# Patient Record
Sex: Female | Born: 1975
Health system: Southern US, Community
[De-identification: ages and names within clinical notes are randomized; demographics above are authoritative.]

## PROBLEM LIST (undated history)

## (undated) DIAGNOSIS — I10 Essential (primary) hypertension: Secondary | ICD-10-CM

## (undated) DIAGNOSIS — F410 Panic disorder [episodic paroxysmal anxiety] without agoraphobia: Secondary | ICD-10-CM

## (undated) DIAGNOSIS — F419 Anxiety disorder, unspecified: Secondary | ICD-10-CM

## (undated) DIAGNOSIS — M069 Rheumatoid arthritis, unspecified: Secondary | ICD-10-CM

## (undated) DIAGNOSIS — J309 Allergic rhinitis, unspecified: Secondary | ICD-10-CM

## (undated) DIAGNOSIS — C4491 Basal cell carcinoma of skin, unspecified: Secondary | ICD-10-CM

## (undated) DIAGNOSIS — I2542 Coronary artery dissection: Secondary | ICD-10-CM

## (undated) DIAGNOSIS — I214 Non-ST elevation (NSTEMI) myocardial infarction: Secondary | ICD-10-CM

## (undated) DIAGNOSIS — Z9289 Personal history of other medical treatment: Secondary | ICD-10-CM

## (undated) HISTORY — DX: Basal cell carcinoma of skin, unspecified: C44.91

## (undated) HISTORY — DX: Allergic rhinitis, unspecified: J30.9

## (undated) HISTORY — DX: Coronary artery dissection: I25.42

## (undated) HISTORY — DX: Personal history of other medical treatment: Z92.89

## (undated) HISTORY — DX: Non-ST elevation (NSTEMI) myocardial infarction: I21.4

## (undated) HISTORY — PX: KNEE SURGERY: SHX244

## (undated) HISTORY — DX: Anxiety disorder, unspecified: F41.9

## (undated) HISTORY — PX: NECK SURGERY: SHX720

---

## 2004-04-15 DIAGNOSIS — C4491 Basal cell carcinoma of skin, unspecified: Secondary | ICD-10-CM

## 2004-04-15 HISTORY — DX: Basal cell carcinoma of skin, unspecified: C44.91

## 2004-06-17 ENCOUNTER — Observation Stay: Payer: Self-pay

## 2004-06-19 ENCOUNTER — Inpatient Hospital Stay: Payer: Self-pay | Admitting: Unknown Physician Specialty

## 2006-04-20 ENCOUNTER — Inpatient Hospital Stay: Payer: Self-pay | Admitting: Obstetrics & Gynecology

## 2009-08-03 ENCOUNTER — Encounter: Payer: Self-pay | Admitting: Obstetrics & Gynecology

## 2009-08-03 ENCOUNTER — Encounter: Payer: Self-pay | Admitting: Maternal and Fetal Medicine

## 2009-08-13 ENCOUNTER — Encounter: Payer: Self-pay | Admitting: Maternal and Fetal Medicine

## 2009-11-01 ENCOUNTER — Observation Stay: Payer: Self-pay

## 2009-11-09 ENCOUNTER — Inpatient Hospital Stay: Payer: Self-pay | Admitting: Obstetrics & Gynecology

## 2009-11-11 DIAGNOSIS — O10919 Unspecified pre-existing hypertension complicating pregnancy, unspecified trimester: Secondary | ICD-10-CM

## 2009-11-11 DIAGNOSIS — O4100X0 Oligohydramnios, unspecified trimester, not applicable or unspecified: Secondary | ICD-10-CM

## 2010-07-15 HISTORY — PX: HERNIA REPAIR: SHX51

## 2010-07-30 ENCOUNTER — Ambulatory Visit: Payer: Self-pay | Admitting: General Surgery

## 2011-09-25 ENCOUNTER — Ambulatory Visit: Payer: Self-pay | Admitting: Internal Medicine

## 2016-01-01 ENCOUNTER — Other Ambulatory Visit: Payer: Self-pay | Admitting: Certified Nurse Midwife

## 2016-05-08 ENCOUNTER — Other Ambulatory Visit: Payer: Self-pay | Admitting: Certified Nurse Midwife

## 2016-05-08 DIAGNOSIS — Z1231 Encounter for screening mammogram for malignant neoplasm of breast: Secondary | ICD-10-CM

## 2016-06-06 ENCOUNTER — Ambulatory Visit
Admission: RE | Admit: 2016-06-06 | Discharge: 2016-06-06 | Disposition: A | Discharge status: Home / Self Care | Payer: BLUE CROSS/BLUE SHIELD | Source: Ambulatory Visit | Attending: Certified Nurse Midwife | Admitting: Certified Nurse Midwife

## 2016-06-06 DIAGNOSIS — Z1231 Encounter for screening mammogram for malignant neoplasm of breast: Secondary | ICD-10-CM | POA: Insufficient documentation

## 2016-06-18 ENCOUNTER — Other Ambulatory Visit: Payer: Self-pay | Admitting: *Deleted

## 2016-06-18 ENCOUNTER — Inpatient Hospital Stay
Admission: RE | Admit: 2016-06-18 | Discharge: 2016-06-18 | Disposition: A | Discharge status: Home / Self Care | Payer: Self-pay | Source: Ambulatory Visit | Attending: *Deleted | Admitting: *Deleted

## 2016-06-18 DIAGNOSIS — Z9289 Personal history of other medical treatment: Secondary | ICD-10-CM

## 2017-04-15 DIAGNOSIS — I214 Non-ST elevation (NSTEMI) myocardial infarction: Secondary | ICD-10-CM

## 2017-04-15 HISTORY — DX: Non-ST elevation (NSTEMI) myocardial infarction: I21.4

## 2017-04-30 ENCOUNTER — Inpatient Hospital Stay
Admission: EM | Admit: 2017-04-30 | Discharge: 2017-05-03 | DRG: 281 | Disposition: A | Discharge status: Home / Self Care | Payer: BLUE CROSS/BLUE SHIELD | Attending: Family Medicine | Admitting: Family Medicine

## 2017-04-30 ENCOUNTER — Emergency Department: Payer: BLUE CROSS/BLUE SHIELD

## 2017-04-30 ENCOUNTER — Encounter: Payer: Self-pay | Admitting: Emergency Medicine

## 2017-04-30 DIAGNOSIS — I214 Non-ST elevation (NSTEMI) myocardial infarction: Principal | ICD-10-CM | POA: Diagnosis present

## 2017-04-30 DIAGNOSIS — Z8249 Family history of ischemic heart disease and other diseases of the circulatory system: Secondary | ICD-10-CM

## 2017-04-30 DIAGNOSIS — Z79899 Other long term (current) drug therapy: Secondary | ICD-10-CM

## 2017-04-30 DIAGNOSIS — R748 Abnormal levels of other serum enzymes: Secondary | ICD-10-CM | POA: Diagnosis present

## 2017-04-30 DIAGNOSIS — I472 Ventricular tachycardia, unspecified: Secondary | ICD-10-CM

## 2017-04-30 DIAGNOSIS — G4700 Insomnia, unspecified: Secondary | ICD-10-CM | POA: Diagnosis present

## 2017-04-30 DIAGNOSIS — M069 Rheumatoid arthritis, unspecified: Secondary | ICD-10-CM | POA: Diagnosis present

## 2017-04-30 DIAGNOSIS — I10 Essential (primary) hypertension: Secondary | ICD-10-CM | POA: Diagnosis present

## 2017-04-30 DIAGNOSIS — F411 Generalized anxiety disorder: Secondary | ICD-10-CM | POA: Diagnosis present

## 2017-04-30 DIAGNOSIS — I2510 Atherosclerotic heart disease of native coronary artery without angina pectoris: Secondary | ICD-10-CM | POA: Diagnosis present

## 2017-04-30 DIAGNOSIS — F410 Panic disorder [episodic paroxysmal anxiety] without agoraphobia: Secondary | ICD-10-CM | POA: Diagnosis present

## 2017-04-30 HISTORY — DX: Essential (primary) hypertension: I10

## 2017-04-30 HISTORY — DX: Panic disorder (episodic paroxysmal anxiety): F41.0

## 2017-04-30 HISTORY — DX: Rheumatoid arthritis, unspecified: M06.9

## 2017-04-30 LAB — TROPONIN I: Troponin I: 0.25 ng/mL (ref <0.03)

## 2017-04-30 LAB — BASIC METABOLIC PANEL
Anion gap: 8 (ref 5–15)
BUN: 20 mg/dL (ref 6–20)
CO2: 28 mmol/L (ref 22–32)
Calcium: 9.4 mg/dL (ref 8.9–10.3)
Chloride: 102 mmol/L (ref 101–111)
Creatinine, Ser: 0.79 mg/dL (ref 0.44–1.00)
GFR calc Af Amer: >60 mL/min (ref >60)
GFR calc non Af Amer: >60 mL/min (ref >60)
Glucose, Bld: 102 mg/dL — ABNORMAL HIGH (ref 65–99)
Potassium: 3.7 mmol/L (ref 3.5–5.1)
Sodium: 138 mmol/L (ref 135–145)

## 2017-04-30 LAB — CBC
HCT: 36.2 % (ref 35.0–47.0)
Hemoglobin: 12 g/dL (ref 12.0–16.0)
MCH: 29.4 pg (ref 26.0–34.0)
MCHC: 33.2 g/dL (ref 32.0–36.0)
MCV: 88.5 fL (ref 80.0–100.0)
Platelets: 287 10*3/uL (ref 150–440)
RBC: 4.1 MIL/uL (ref 3.80–5.20)
RDW: 13.3 % (ref 11.5–14.5)
WBC: 6.6 10*3/uL (ref 3.6–11.0)

## 2017-04-30 LAB — POCT PREGNANCY, URINE: Preg Test, Ur: NEGATIVE

## 2017-04-30 MED ORDER — ASPIRIN 81 MG PO CHEW
324.0000 mg | CHEWABLE_TABLET | Freq: Once | ORAL | Status: AC
  Administered 2017-04-30: 324 mg via ORAL
  Filled 2017-04-30: qty 4

## 2017-04-30 NOTE — ED Notes (Signed)
Pt reports tightness in chest, anxiety, and SOB. Family at bedside. Started around 9pm tonight.

## 2017-04-30 NOTE — ED Triage Notes (Signed)
Pt comes into the ED via POV c/o central chest pain that is causing shortness of breath.  Patient states that when it happened she felt like she was going to pass out and this occurred yesterday as well.  Patient describes it as "icky and weird and pressure in the center of my chest".  Patient is alert and oriented and in NAD with even and unlabored respirations.  Patient denies any dizziness, diaphoresis, or nausea.

## 2017-04-30 NOTE — ED Provider Notes (Addendum)
[facility] Emergency Department Provider Note  ____________________________________________   First MD Initiated Contact with Patient 04/30/17 2251     (approximate)  I have reviewed the triage vital signs and the nursing notes.   HISTORY  Chief Complaint Chest Pain   HPI Nicole Zuniga is a 42 y.o. female who self presents to the emergency department with a 20-minute episode of crushing substernal moderate severity nonradiating chest pain that began while she was sitting on the couch at 9 PM roughly 1 hour prior to arrival.  Lasted about 20 minutes very intense and then slowly subsided on its own.  It was nonexertional.  It was associated with shortness of breath.  Not associated with nausea or vomiting.  No diaphoresis.  She has a past medical history of anxiety and rheumatoid arthritis.  She has no coronary artery disease history.  She has never had a stroke.  She has had no recent surgery travel or immobilization.  No leg swelling.  The pain came on rather suddenly and went away on its own.  Nothing in particular seems to make it better or worse.  Past Medical History:  Diagnosis Date  . Panic attack   . RA (rheumatoid arthritis) (HCC)     There are no active problems to display for this patient.   Past Surgical History:  Procedure Laterality Date  . KNEE SURGERY    . NECK SURGERY      Prior to Admission medications   Medication Sig Start Date End Date Taking? Authorizing Provider  ALPRAZolam Duanne Moron) 0.25 MG tablet Take 0.25 mg by mouth at bedtime as needed for anxiety.  04/23/17  Yes [provider]  bisoprolol (ZEBETA) 5 MG tablet Take 5 mg by mouth daily. 04/03/17  Yes [provider]  Cholecalciferol (VITAMIN D) 2000 units tablet Take 2,000 Units by mouth daily.   Yes [provider]  ENBREL SURECLICK 50 MG/ML injection Inject 50 mg into the skin once a week. Takes on the weekend 04/26/17  Yes [provider]  levocetirizine (XYZAL) 5 MG tablet Take 5 mg by mouth daily. 03/21/16  Yes [provider]  Multiple Vitamin (MULTI-VITAMINS) TABS Take 1 tablet by mouth daily. 03/03/07  Yes [provider]    Allergies Patient has no known allergies.  No family history on file.  Social History Social History   Tobacco Use  . Smoking status: Never Smoker  . Smokeless tobacco: Never Used  Substance Use Topics  . Alcohol use: No    Frequency: Never  . Drug use: No    Review of Systems Constitutional: No fever/chills Eyes: No visual changes. ENT: No sore throat. Cardiovascular: Positive for chest pain. Respiratory: Positive for shortness of breath. Gastrointestinal: No abdominal pain.  No nausea, no vomiting.  No diarrhea.  No constipation. Genitourinary: Negative for dysuria. Musculoskeletal: Negative for back pain. Skin: Negative for rash. Neurological: Negative for headaches, focal weakness or numbness.   ____________________________________________   PHYSICAL EXAM:  VITAL SIGNS: ED Triage Vitals [04/30/17 2205]  Enc Vitals Group     BP      Pulse      Resp      Temp      Temp src      SpO2      Weight 174 lb (78.9 kg)     Height 5\' 9"  (1.753 m)     Head Circumference      Peak Flow      Pain  Score 6     Pain Loc      Pain Edu?      Excl. in [place]?     Constitutional: Alert and oriented x4 somewhat anxious appearing nontoxic no diaphoresis speaks in full clear sentences Eyes: PERRL EOMI. Head: Atraumatic. Nose: No congestion/rhinnorhea. Mouth/Throat: No trismus Neck: No stridor.  Able to lie completely flat with no JVD Cardiovascular: Normal rate, regular rhythm. Grossly normal heart sounds.  Good peripheral circulation. Respiratory: Normal respiratory effort.  No retractions. Lungs CTAB and moving good air Gastrointestinal: Soft nontender Musculoskeletal: No lower extremity edema legs are equal in size neurologic:  Normal speech and language. No  gross focal neurologic deficits are appreciated. Skin:  Skin is warm, dry and intact. No rash noted. Psychiatric: Somewhat anxious appearing   ____________________________________________   DIFFERENTIAL includes but not limited to  Acute coronary syndrome, pulmonary embolism, aortic dissection, pericarditis, myocarditis, anxiety ____________________________________________   LABS (all labs ordered are listed, but only abnormal results are displayed)  Labs Reviewed  BASIC METABOLIC PANEL - Abnormal; Notable for the following components:      Result Value   Glucose, Bld 102 (*)    All other components within normal limits  TROPONIN I - Abnormal; Notable for the following components:   Troponin I 0.25 (*)    All other components within normal limits  CBC  POC URINE PREG, ED  POCT PREGNANCY, URINE    Elevated troponin in the setting of normal renal function concerning for acute myocardial ischemia __________________________________________  EKG  ED ECG REPORT I, Darel Hong, the attending physician, personally viewed and interpreted this ECG.  Date: 04/30/2017 EKG Time:  Rate: 69 Rhythm: normal sinus rhythm QRS Axis: normal Intervals: normal ST/T Wave abnormalities: normal Narrative Interpretation: no evidence of acute ischemia  ____________________________________________  RADIOLOGY  Chest x-ray reviewed by me with no acute disease ____________________________________________   PROCEDURES  Procedure(s) performed: no  .Critical Care Performed by: Darel Hong, MD Authorized by: Darel Hong, MD   Critical care provider statement:    Critical care time (minutes):  35   Critical care time was exclusive of:  Separately billable procedures and treating other patients   Critical care was necessary to treat or prevent imminent or life-threatening deterioration of the following conditions:  Cardiac failure   Critical care was time spent personally by me  on the following activities:  Development of treatment plan with patient or surrogate, discussions with consultants, evaluation of patient's response to treatment, examination of patient, obtaining history from patient or surrogate, ordering and performing treatments and interventions, ordering and review of laboratory studies, ordering and review of radiographic studies, pulse oximetry, re-evaluation of patient's condition and review of old charts    Critical Care performed: yes  Observation: no ____________________________________________   INITIAL IMPRESSION / ASSESSMENT AND PLAN / ED COURSE  Pertinent labs & imaging results that were available during my care of the patient were reviewed by me and considered in my medical decision making (see chart for details).  The patient's story of substernal crushing chest pain is concerning for acute coronary syndrome.  While her EKG is nonischemic her troponin is elevated at 0.25 concerning for acute myocardial ischemia.  She remains hemodynamically stable and is currently pain-free.  I will give her 324 mg of aspirin but at this point she requires inpatient admission for serial troponins, echocardiogram, cardiac evaluation, and telemetry.  I discussed with the patient and family who verbalized understanding and agreed with the plan.  I then discussed with the hospitalist who is graciously agreed to admit the patient to her service.    ----------------------------------------- 12:34 AM on 05/01/2017 -----------------------------------------  The patient has developed multiple runs of sustained ventricular tachycardia.  She has a normal blood pressure and is mentating normally.  She has had up to 15-20 beats of wide-complex around 130 BPM.  I discussed with the on-call cardiologist Dr. Josefa Half who recommends amiodarone drip should the patient's symptoms persist or worsen.  ____________________________________________   FINAL CLINICAL  IMPRESSION(S) / ED DIAGNOSES  Final diagnoses:  NSTEMI (non-ST elevated myocardial infarction) ([place])  Ventricular tachycardia ([place])      NEW MEDICATIONS STARTED DURING THIS VISIT:  New Prescriptions   No medications on file     Note:  This document was prepared using Dragon voice recognition software and may include unintentional dictation errors.     Darel Hong, MD 04/30/17 2340    Darel Hong, MD 05/01/17 [phone]

## 2017-05-01 ENCOUNTER — Other Ambulatory Visit: Payer: Self-pay

## 2017-05-01 ENCOUNTER — Inpatient Hospital Stay (HOSPITAL_COMMUNITY)
Admit: 2017-05-01 | Discharge: 2017-05-01 | Disposition: A | Discharge status: Home / Self Care | Payer: BLUE CROSS/BLUE SHIELD | Attending: Cardiovascular Disease | Admitting: Cardiovascular Disease

## 2017-05-01 DIAGNOSIS — G4700 Insomnia, unspecified: Secondary | ICD-10-CM | POA: Diagnosis present

## 2017-05-01 DIAGNOSIS — I472 Ventricular tachycardia: Secondary | ICD-10-CM

## 2017-05-01 DIAGNOSIS — R748 Abnormal levels of other serum enzymes: Secondary | ICD-10-CM | POA: Diagnosis present

## 2017-05-01 DIAGNOSIS — F419 Anxiety disorder, unspecified: Secondary | ICD-10-CM | POA: Diagnosis not present

## 2017-05-01 DIAGNOSIS — F411 Generalized anxiety disorder: Secondary | ICD-10-CM | POA: Diagnosis present

## 2017-05-01 DIAGNOSIS — M069 Rheumatoid arthritis, unspecified: Secondary | ICD-10-CM | POA: Diagnosis present

## 2017-05-01 DIAGNOSIS — I10 Essential (primary) hypertension: Secondary | ICD-10-CM

## 2017-05-01 DIAGNOSIS — I2510 Atherosclerotic heart disease of native coronary artery without angina pectoris: Secondary | ICD-10-CM | POA: Diagnosis present

## 2017-05-01 DIAGNOSIS — F410 Panic disorder [episodic paroxysmal anxiety] without agoraphobia: Secondary | ICD-10-CM | POA: Diagnosis present

## 2017-05-01 DIAGNOSIS — I214 Non-ST elevation (NSTEMI) myocardial infarction: Secondary | ICD-10-CM

## 2017-05-01 DIAGNOSIS — Z79899 Other long term (current) drug therapy: Secondary | ICD-10-CM | POA: Diagnosis not present

## 2017-05-01 DIAGNOSIS — Z8249 Family history of ischemic heart disease and other diseases of the circulatory system: Secondary | ICD-10-CM | POA: Diagnosis not present

## 2017-05-01 LAB — CBC
HCT: 38.2 % (ref 35.0–47.0)
Hemoglobin: 12.4 g/dL (ref 12.0–16.0)
MCH: 28.9 pg (ref 26.0–34.0)
MCHC: 32.5 g/dL (ref 32.0–36.0)
MCV: 89 fL (ref 80.0–100.0)
Platelets: 290 10*3/uL (ref 150–440)
RBC: 4.29 MIL/uL (ref 3.80–5.20)
RDW: 13.5 % (ref 11.5–14.5)
WBC: 6 10*3/uL (ref 3.6–11.0)

## 2017-05-01 LAB — TROPONIN I
Troponin I: 4.38 ng/mL (ref <0.03)
Troponin I: 3.6 ng/mL (ref <0.03)
Troponin I: 2.47 ng/mL (ref <0.03)

## 2017-05-01 LAB — APTT: aPTT: 29 seconds (ref 24–36)

## 2017-05-01 LAB — BASIC METABOLIC PANEL
Anion gap: 8 (ref 5–15)
BUN: 17 mg/dL (ref 6–20)
CO2: 27 mmol/L (ref 22–32)
Calcium: 9.6 mg/dL (ref 8.9–10.3)
Chloride: 104 mmol/L (ref 101–111)
Creatinine, Ser: 0.64 mg/dL (ref 0.44–1.00)
GFR calc Af Amer: >60 mL/min (ref >60)
GFR calc non Af Amer: >60 mL/min (ref >60)
Glucose, Bld: 108 mg/dL — ABNORMAL HIGH (ref 65–99)
Potassium: 3.6 mmol/L (ref 3.5–5.1)
Sodium: 139 mmol/L (ref 135–145)

## 2017-05-01 LAB — GLUCOSE, CAPILLARY: Glucose-Capillary: 95 mg/dL (ref 65–99)

## 2017-05-01 LAB — ECHOCARDIOGRAM COMPLETE
Height: 69.5 in
Weight: 2833.6 oz

## 2017-05-01 LAB — MAGNESIUM: Magnesium: 1.8 mg/dL (ref 1.7–2.4)

## 2017-05-01 LAB — PROTIME-INR
INR: 1.01
Prothrombin Time: 13.2 seconds (ref 11.4–15.2)

## 2017-05-01 LAB — HEPARIN LEVEL (UNFRACTIONATED)
Heparin Unfractionated: 0.28 IU/mL — ABNORMAL LOW (ref 0.30–0.70)
Heparin Unfractionated: 0.48 IU/mL (ref 0.30–0.70)

## 2017-05-01 MED ORDER — AMIODARONE HCL IN DEXTROSE 360-4.14 MG/200ML-% IV SOLN
60.0000 mg/h | INTRAVENOUS | Status: DC
  Administered 2017-05-01: 60 mg/h via INTRAVENOUS

## 2017-05-01 MED ORDER — BISACODYL 5 MG PO TBEC: 5.0000 mg | DELAYED_RELEASE_TABLET | Freq: Every day | ORAL | Status: DC | PRN

## 2017-05-01 MED ORDER — AMIODARONE LOAD VIA INFUSION
150.0000 mg | Freq: Once | INTRAVENOUS | Status: DC
  Filled 2017-05-01: qty 83.34

## 2017-05-01 MED ORDER — TEMAZEPAM 15 MG PO CAPS
15.0000 mg | ORAL_CAPSULE | Freq: Every day | ORAL | Status: DC
  Administered 2017-05-01 – 2017-05-02 (×2): 15 mg via ORAL
  Filled 2017-05-01 (×2): qty 1

## 2017-05-01 MED ORDER — ETANERCEPT 50 MG/ML ~~LOC~~ SOAJ: 50.0000 mg | SUBCUTANEOUS | Status: DC

## 2017-05-01 MED ORDER — LOSARTAN POTASSIUM 25 MG PO TABS
25.0000 mg | ORAL_TABLET | Freq: Every day | ORAL | Status: DC
  Administered 2017-05-01 – 2017-05-03 (×3): 25 mg via ORAL
  Filled 2017-05-01 (×3): qty 1

## 2017-05-01 MED ORDER — ONDANSETRON HCL 4 MG PO TABS: 4.0000 mg | ORAL_TABLET | Freq: Four times a day (QID) | ORAL | Status: DC | PRN

## 2017-05-01 MED ORDER — AMIODARONE HCL IN DEXTROSE 360-4.14 MG/200ML-% IV SOLN
30.0000 mg/h | INTRAVENOUS | Status: DC
  Administered 2017-05-01 (×3): 30 mg/h via INTRAVENOUS
  Filled 2017-05-01 (×2): qty 200

## 2017-05-01 MED ORDER — AMIODARONE LOAD VIA INFUSION: 150.0000 mg | Freq: Once | INTRAVENOUS | Status: DC

## 2017-05-01 MED ORDER — DOCUSATE SODIUM 100 MG PO CAPS
100.0000 mg | ORAL_CAPSULE | Freq: Two times a day (BID) | ORAL | Status: DC
  Administered 2017-05-01 – 2017-05-03 (×3): 100 mg via ORAL
  Filled 2017-05-01 (×4): qty 1

## 2017-05-01 MED ORDER — LEVOCETIRIZINE DIHYDROCHLORIDE 5 MG PO TABS: 5.0000 mg | ORAL_TABLET | Freq: Every day | ORAL | Status: DC

## 2017-05-01 MED ORDER — ACETAMINOPHEN 650 MG RE SUPP: 650.0000 mg | Freq: Four times a day (QID) | RECTAL | Status: DC | PRN

## 2017-05-01 MED ORDER — VITAMIN D 1000 UNITS PO TABS
2000.0000 [IU] | ORAL_TABLET | Freq: Every day | ORAL | Status: DC
  Administered 2017-05-02 – 2017-05-03 (×2): 2000 [IU] via ORAL
  Filled 2017-05-01 (×3): qty 2

## 2017-05-01 MED ORDER — ASPIRIN EC 81 MG PO TBEC
81.0000 mg | DELAYED_RELEASE_TABLET | Freq: Every day | ORAL | Status: DC
  Administered 2017-05-01 – 2017-05-03 (×3): 81 mg via ORAL
  Filled 2017-05-01 (×3): qty 1

## 2017-05-01 MED ORDER — HEPARIN SODIUM (PORCINE) 5000 UNIT/ML IJ SOLN: 5000.0000 [IU] | Freq: Three times a day (TID) | INTRAMUSCULAR | Status: DC

## 2017-05-01 MED ORDER — HEPARIN BOLUS VIA INFUSION
4000.0000 [IU] | Freq: Once | INTRAVENOUS | Status: AC
  Administered 2017-05-01: 4000 [IU] via INTRAVENOUS
  Filled 2017-05-01: qty 4000

## 2017-05-01 MED ORDER — HEPARIN (PORCINE) IN NACL 100-0.45 UNIT/ML-% IJ SOLN
1150.0000 [IU]/h | INTRAMUSCULAR | Status: DC
  Administered 2017-05-01: 1150 [IU]/h via INTRAVENOUS
  Administered 2017-05-01: 1000 [IU]/h via INTRAVENOUS
  Filled 2017-05-01 (×2): qty 250

## 2017-05-01 MED ORDER — HEPARIN BOLUS VIA INFUSION
1200.0000 [IU] | Freq: Once | INTRAVENOUS | Status: AC
  Administered 2017-05-01: 1200 [IU] via INTRAVENOUS
  Filled 2017-05-01: qty 1200

## 2017-05-01 MED ORDER — ACETAMINOPHEN 325 MG PO TABS
650.0000 mg | ORAL_TABLET | Freq: Four times a day (QID) | ORAL | Status: DC | PRN
Start: 2017-05-01 — End: 2017-05-03

## 2017-05-01 MED ORDER — TRAZODONE HCL 50 MG PO TABS: 25.0000 mg | ORAL_TABLET | Freq: Every evening | ORAL | Status: DC | PRN

## 2017-05-01 MED ORDER — AMIODARONE IV BOLUS ONLY 150 MG/100ML
INTRAVENOUS | Status: AC
  Administered 2017-05-01: 150 mg
  Filled 2017-05-01: qty 100

## 2017-05-01 MED ORDER — BISOPROLOL FUMARATE 5 MG PO TABS
5.0000 mg | ORAL_TABLET | Freq: Every day | ORAL | Status: DC
  Administered 2017-05-01 – 2017-05-03 (×3): 5 mg via ORAL
  Filled 2017-05-01 (×4): qty 1

## 2017-05-01 MED ORDER — ATORVASTATIN CALCIUM 20 MG PO TABS: 80.0000 mg | ORAL_TABLET | Freq: Every day | ORAL | Status: DC

## 2017-05-01 MED ORDER — HYDROCODONE-ACETAMINOPHEN 5-325 MG PO TABS: 1.0000 | ORAL_TABLET | ORAL | Status: DC | PRN

## 2017-05-01 MED ORDER — AMIODARONE HCL IN DEXTROSE 360-4.14 MG/200ML-% IV SOLN
INTRAVENOUS | Status: AC
  Filled 2017-05-01: qty 200

## 2017-05-01 MED ORDER — LORATADINE 10 MG PO TABS
10.0000 mg | ORAL_TABLET | Freq: Every day | ORAL | Status: DC
  Administered 2017-05-02 – 2017-05-03 (×2): 10 mg via ORAL
  Filled 2017-05-01 (×3): qty 1

## 2017-05-01 MED ORDER — ADULT MULTIVITAMIN W/MINERALS CH
1.0000 | ORAL_TABLET | Freq: Every day | ORAL | Status: DC
  Administered 2017-05-02 – 2017-05-03 (×2): 1 via ORAL
  Filled 2017-05-01 (×3): qty 1

## 2017-05-01 MED ORDER — ONDANSETRON HCL 4 MG/2ML IJ SOLN: 4.0000 mg | Freq: Four times a day (QID) | INTRAMUSCULAR | Status: DC | PRN

## 2017-05-01 MED ORDER — ALPRAZOLAM 0.25 MG PO TABS
0.2500 mg | ORAL_TABLET | Freq: Every evening | ORAL | Status: DC | PRN
  Administered 2017-05-01: 0.25 mg via ORAL
  Filled 2017-05-01: qty 1

## 2017-05-01 MED ORDER — SODIUM CHLORIDE 0.9% FLUSH
3.0000 mL | Freq: Two times a day (BID) | INTRAVENOUS | Status: DC
  Administered 2017-05-02 – 2017-05-03 (×3): 3 mL via INTRAVENOUS

## 2017-05-01 NOTE — Consult Note (Signed)
Cardiology Consult    Patient ID: Nicole Zuniga MRN: [id number], DOB/AGE: 11/08/1975   Admit date: 04/30/2017 Date of Consult: 05/01/2017  Primary Physician: Katheren Shams Primary Cardiologist: Ida Rogue, MD Requesting Provider: Ashok Norris, MD  Patient Profile    Nicole Zuniga is a 42 y.o. female with a history of anxiety, RA, and HTN, who is being seen today for the evaluation of NSTEMI at the request of Dr. Jerelyn Charles.  Past Medical History   Past Medical History:  Diagnosis Date  . Essential hypertension   . Panic attack   . RA (rheumatoid arthritis) ([place])     Past Surgical History:  Procedure Laterality Date  . KNEE SURGERY    . NECK SURGERY       Allergies  No Known Allergies  History of Present Illness    42 y/o ? with the above PMH including panic attacks/anxiety, RA since childhood, and HTN (Dx ~ 3 yrs ago).  She has no prior cardiac hx but her father does have a h/o CAD s/p CABG in his late 104's, along with a h/o cardiomyopathy s/p ICD.  She lives locally with her husband and three children and exercises regularly.  She says that over the years, she would periodically note episodes of palpitations that are generally brief in nature.  She has also experienced intermittent restlessness and mild dyspnea, or at the very least, the sensation that she can't get one good breath, generally in the setting of anxiety or what she describes as a panic attack.  She will sometimes use prn xanax for these episodes, though she will mostly try to calm herself with slow breathing.  She exercises regularly and had never noted limitations in activity related to dyspnea or chest pain/pressure.  She was in her [place] until earlier this week, when she was @ a funeral of distant cousin of her husbands.  She had some emotional distress in that setting and began to note what she would describe as anxiety and also difficulty getting a satisfying breath (she was not necessarily  short of breath).  This eventually improved but throughout the day on Wednesday, she felt "icky," which she describes as being somewhat restless, associated with occasional palpitations, and need to take deep breaths.  On the evening of 1/16, she was sitting on her couch and she began to note moderate substernal chest pressure.  She also noted intermittent palpitations.  She thinks she might have had chest pressure like this before but never for any amount of time.  As symptoms persisted, she called her parents who eventually came to her home and brought her to [facility].  Here, ECG was non-acute, however trop was elevated @ 0.25 and subsequently 4.38.  While in the ED, she was also noted to have runs of NSVT associated with palpitations. No presyncope/syncope.  She was placed on IV amio, which was shut off earlier this AM secondary to HRs in the 50's.  Off of amio, she did have another 11 beat run of NSVT.  She is currently chest pain free.  Inpatient Medications    . amiodarone  150 mg Intravenous Once  . aspirin EC  81 mg Oral Daily  . bisoprolol  5 mg Oral Daily  . cholecalciferol  2,000 Units Oral Daily  . docusate sodium  100 mg Oral BID  . etanercept  50 mg Subcutaneous Weekly  . loratadine  10 mg Oral Daily  . multivitamin with minerals  1 tablet Oral Daily  Family History    Family History  Problem Relation Age of Onset  . Hypertension Mother   . Hyperlipidemia Mother   . CAD Father        s/p CABG in his late 69's  . Hypertension Father   . Hyperlipidemia Father   . Congestive Heart Failure Father    indicated that her mother is alive. She indicated that her father is alive. She indicated that her sister is alive.   Social History    Social History   Socioeconomic History  . Marital status: Married    Spouse name: Not on file  . Number of children: Not on file  . Years of education: Not on file  . Highest education level: Not on file  Social Needs  . Financial resource  strain: Not on file  . Food insecurity - worry: Not on file  . Food insecurity - inability: Not on file  . Transportation needs - medical: Not on file  . Transportation needs - non-medical: Not on file  Occupational History  . Not on file  Tobacco Use  . Smoking status: Never Smoker  . Smokeless tobacco: Never Used  Substance and Sexual Activity  . Alcohol use: No    Frequency: Never  . Drug use: No  . Sexual activity: Not on file  Other Topics Concern  . Not on file  Social History Narrative   Lives locally with her husband and three children (ages 75, 52, 59).  Works as Landscape architect.  Exercises regularly.     Review of Systems    General:  No chills, fever, night sweats or weight changes.  Cardiovascular:  +++ chest pain, +++ dyspnea, no edema, orthopnea, palpitations, paroxysmal nocturnal dyspnea. Dermatological: No rash, lesions/masses Respiratory: No cough, +++ dyspnea Urologic: No hematuria, dysuria Abdominal:   No nausea, vomiting, diarrhea, bright red blood per rectum, melena, or hematemesis Neurologic:  No visual changes, wkns, changes in mental status. Psych: +++ anxiety. All other systems reviewed and are otherwise negative except as noted above.  Physical Exam    Blood pressure (!) 145/86, pulse 69, temperature 98.3 F (36.8 C), temperature source Oral, resp. rate 18, height 5' 9.5" (1.765 m), weight 177 lb 1.6 oz (80.3 kg), last menstrual period 04/30/2017, SpO2 98 %.  General: Pleasant, NAD Psych: Normal affect. Neuro: Alert and oriented X 3. Moves all extremities spontaneously. HEENT: Normal  Neck: Supple without bruits or JVD. Lungs:  Resp regular and unlabored, CTA. Heart: RRR no s3, s4, or murmurs. Abdomen: Soft, non-tender, non-distended, BS + x 4.  Extremities: No clubbing, cyanosis or edema. DP/PT/Radials 2+ and equal bilaterally.  Labs     Recent Labs    04/30/17 2208 05/01/17 0412  TROPONINI 0.25* 4.38*   Lab Results  Component Value  Date   WBC 6.0 05/01/2017   HGB 12.4 05/01/2017   HCT 38.2 05/01/2017   MCV 89.0 05/01/2017   PLT 290 05/01/2017    Recent Labs  Lab 05/01/17 0412  NA 139  K 3.6  CL 104  CO2 27  BUN 17  CREATININE 0.64  CALCIUM 9.6  GLUCOSE 108*    Radiology Studies    Dg Chest 2 View  Result Date: 04/30/2017 CLINICAL DATA:  Chest pain EXAM: CHEST  2 VIEW COMPARISON:  None. FINDINGS: The heart size and mediastinal contours are within normal limits. Both lungs are clear. The visualized skeletal structures are unremarkable. IMPRESSION: No active cardiopulmonary disease. Electronically Signed   By: Lennette Bihari  Collins [place] M.D.   On: 04/30/2017 22:38    ECG & Cardiac Imaging    RSR, 69, no acute ST/T changes.  Assessment & Plan    1. NSTEMI:  Pt w/o prior cardiac history, though with family hx for premature CAD (father s/p CABG in his 40's), presented to the ED with chest pain and palpitations on 1/16 and was found to have runs of NSVT and an elevated troponin of .025  4.38.  She is chest pain free this AM and remains on ASA,  blocker, heparin.  Adding high potency statin (LDL 79 in December) in the setting of ACS.  Will obtain echo to assess EF and wall motion and plan on diagnostic cath tomorrow morning.  Would not be surprised to find takotsubo CM in setting of recent emotional upset - some suggestion that symptoms started while at a funeral on 1/15.  2.  Essential HTN:  BP elevated on arrival =- 145/86 this AM.  Cont  blocker.  Will add losartan.  3.  Lipids:  Recent lipid profile 12/6  TC 133, TG 68, HDL 40.4, LDL 79.  Adding high potency statin in the setting of ACS.  4.  Anxiety/H/o panic attacks: uses prn xanax @ home.  5.  Rheumatoid Arthritis: On etanercept.  If EF depressed, will prob need to d/c.  Signed, Murray Hodgkins, NP 05/01/2017, 9:25 AM  For questions or updates, please contact   Please consult [email or web address] for contact info under Cardiology/STEMI.

## 2017-05-01 NOTE — Progress Notes (Signed)
[facility] [place] NAME: Nicole Zuniga    MR#:  [id number]  DATE OF BIRTH:  November 07, 1975  SUBJECTIVE:  CHIEF COMPLAINT:   Chief Complaint  Patient presents with  . Chest Pain  Patient without complaint, mother at the bedside, case discussed with Dr. Gollan/cardiology  REVIEW OF SYSTEMS:  CONSTITUTIONAL: No fever, fatigue or weakness.  EYES: No blurred or double vision.  EARS, NOSE, AND THROAT: No tinnitus or ear pain.  RESPIRATORY: No cough, shortness of breath, wheezing or hemoptysis.  CARDIOVASCULAR: No chest pain, orthopnea, edema.  GASTROINTESTINAL: No nausea, vomiting, diarrhea or abdominal pain.  GENITOURINARY: No dysuria, hematuria.  ENDOCRINE: No polyuria, nocturia,  HEMATOLOGY: No anemia, easy bruising or bleeding SKIN: No rash or lesion. MUSCULOSKELETAL: No joint pain or arthritis.   NEUROLOGIC: No tingling, numbness, weakness.  PSYCHIATRY: No anxiety or depression.   ROS  DRUG ALLERGIES:  No Known Allergies  VITALS:  Blood pressure 127/80, pulse 62, temperature 98.3 F (36.8 C), temperature source Oral, resp. rate 18, height 5' 9.5" (1.765 m), weight 80.3 kg (177 lb 1.6 oz), last menstrual period 04/30/2017, SpO2 98 %.  PHYSICAL EXAMINATION:  GENERAL:  42 y.o.-year-old patient lying in the bed with no acute distress.  EYES: Pupils equal, round, reactive to light and accommodation. No scleral icterus. Extraocular muscles intact.  HEENT: Head atraumatic, normocephalic. Oropharynx and nasopharynx clear.  NECK:  Supple, no jugular venous distention. No thyroid enlargement, no tenderness.  LUNGS: Normal breath sounds bilaterally, no wheezing, rales,rhonchi or crepitation. No use of accessory muscles of respiration.  CARDIOVASCULAR: S1, S2 normal. No murmurs, rubs, or gallops.  ABDOMEN: Soft, nontender, nondistended. Bowel sounds present. No organomegaly or mass.  EXTREMITIES: No pedal edema, cyanosis, or clubbing.   NEUROLOGIC: Cranial nerves II through XII are intact. Muscle strength 5/5 in all extremities. Sensation intact. Gait not checked.  PSYCHIATRIC: The patient is alert and oriented x 3.  SKIN: No obvious rash, lesion, or ulcer.   Physical Exam LABORATORY PANEL:   CBC Recent Labs  Lab 05/01/17 0412  WBC 6.0  HGB 12.4  HCT 38.2  PLT 290   ------------------------------------------------------------------------------------------------------------------  Chemistries  Recent Labs  Lab 05/01/17 0412 05/01/17 1053  NA 139  --   K 3.6  --   CL 104  --   CO2 27  --   GLUCOSE 108*  --   BUN 17  --   CREATININE 0.64  --   CALCIUM 9.6  --   MG  --  1.8   ------------------------------------------------------------------------------------------------------------------  Cardiac Enzymes Recent Labs  Lab 05/01/17 0412 05/01/17 1053  TROPONINI 4.38* 3.60*   ------------------------------------------------------------------------------------------------------------------  RADIOLOGY:  Dg Chest 2 View  Result Date: 04/30/2017 CLINICAL DATA:  Chest pain EXAM: CHEST  2 VIEW COMPARISON:  None. FINDINGS: The heart size and mediastinal contours are within normal limits. Both lungs are clear. The visualized skeletal structures are unremarkable. IMPRESSION: No active cardiopulmonary disease. Electronically Signed   By: Ulyses Jarred M.D.   On: 04/30/2017 22:38    ASSESSMENT AND PLAN:  1 acute non-STEMI Stable Continue aspirin, statin therapy, beta-blocker therapy, heparin drip, adult pain regiment, supplemental oxygen as needed, follow-up on echocardiogram, for heart catheterization on tomorrow  2 nonsustained ventricular tachycardia Resolved Follow-up on echocardiogram-in discussion with Dr. Gollan/cardiology-if echo is normal with placed on beta-blocker therapy and discontinue amiodarone   3 chronic rheumatoid arthritis Stable On Enbrel  4 chronic GAD,  unspecified Stable Continue current regiment  5 acute insomnia Restoril at bedtime  All the records are reviewed and case discussed with Care Management/Social Workerr. Management plans discussed with the patient, family and they are in agreement.  CODE STATUS: full  TOTAL TIME TAKING CARE OF THIS PATIENT: 40 minutes.     POSSIBLE D/C IN 1-2 DAYS, DEPENDING ON CLINICAL CONDITION.   Avel Peace Shunsuke Granzow M.D on 05/01/2017   Between 7am to 6pm - Pager - [phone]  After 6pm go to [email or web address] - password [email or web address] [facility]  Office  [phone]  CC: Primary care physician; Katheren Shams  Note: This dictation was prepared with Dragon dictation along with smaller phrase technology. Any transcriptional errors that result from this process are unintentional.

## 2017-05-01 NOTE — Progress Notes (Signed)
*  PRELIMINARY RESULTS* Echocardiogram 2D Echocardiogram has been performed.  Nicole Zuniga Nicole Zuniga 05/01/2017, 1:51 PM

## 2017-05-01 NOTE — Progress Notes (Signed)
ANTICOAGULATION CONSULT NOTE - Initial Consult  Pharmacy Consult for heparin Indication: chest pain/ACS  No Known Allergies  Patient Measurements: Height: 5' 9.5" (176.5 cm) Weight: 177 lb 1.6 oz (80.3 kg) IBW/kg (Calculated) : 67.35 Heparin Dosing Weight: 80 kg  Vital Signs: Temp: 98.2 F (36.8 C) (01/17 2059) Temp Source: Oral (01/17 2059) BP: 122/73 (01/17 2059) Pulse Rate: 64 (01/17 2059)  Labs: Recent Labs    04/30/17 2208 05/01/17 0412 05/01/17 0609 05/01/17 1053 05/01/17 1250 05/01/17 1627 05/01/17 1951  HGB 12.0 12.4  --   --   --   --   --   HCT 36.2 38.2  --   --   --   --   --   PLT 287 290  --   --   --   --   --   APTT  --   --  29  --   --   --   --   LABPROT  --   --  13.2  --   --   --   --   INR  --   --  1.01  --   --   --   --   HEPARINUNFRC  --   --   --   --  0.28*  --  0.48  CREATININE 0.79 0.64  --   --   --   --   --   TROPONINI 0.25* 4.38*  --  3.60*  --  2.47*  --     Estimated Creatinine Clearance: 96.5 mL/min (by C-G formula based on SCr of 0.64 mg/dL).   Medical History: Past Medical History:  Diagnosis Date  . Essential hypertension   . Panic attack   . RA (rheumatoid arthritis) (HCC)     Medications:  Scheduled:  . amiodarone  150 mg Intravenous Once  . aspirin EC  81 mg Oral Daily  . atorvastatin  80 mg Oral q1800  . bisoprolol  5 mg Oral Daily  . cholecalciferol  2,000 Units Oral Daily  . docusate sodium  100 mg Oral BID  . etanercept  50 mg Subcutaneous Weekly  . loratadine  10 mg Oral Daily  . losartan  25 mg Oral Daily  . multivitamin with minerals  1 tablet Oral Daily  . sodium chloride flush  3 mL Intravenous Q12H  . temazepam  15 mg Oral QHS    Assessment: Patient admitted for crushing chest pain w/ trops 0.25 >> 4.38, was given ASA and nitro in ED then had some runs of Vtach, then was placed on amio Patient is not on anticoagulation PTA Patient is being started on heparin drip for NSTEMI  Goal of Therapy:   Heparin level 0.3-0.7 units/ml Monitor platelets by anticoagulation protocol: Yes   Plan:  HL = 0.48 is therapeutic. Continue heparin drip at current rate of 1150 units/hr and order confirmatory HL in 6 hours. CBC with AM labs tomorrow.  Lenis Noon, Pharm.D, BCPS Clinical Pharmacist 05/01/2017

## 2017-05-01 NOTE — Progress Notes (Addendum)
ANTICOAGULATION CONSULT NOTE - Initial Consult  Pharmacy Consult for heparin Indication: chest pain/ACS  No Known Allergies  Patient Measurements: Height: 5' 9.5" (176.5 cm) Weight: 177 lb 1.6 oz (80.3 kg) IBW/kg (Calculated) : 67.35 Heparin Dosing Weight: 80 kg  Vital Signs: Temp: 98.3 F (36.8 C) (01/17 0518) Temp Source: Oral (01/17 0518) BP: 140/81 (01/17 0518) Pulse Rate: 63 (01/17 0518)  Labs: Recent Labs    04/30/17 2208 05/01/17 0412  HGB 12.0 12.4  HCT 36.2 38.2  PLT 287 290  CREATININE 0.79 0.64  TROPONINI 0.25* 4.38*    Estimated Creatinine Clearance: 96.5 mL/min (by C-G formula based on SCr of 0.64 mg/dL).   Medical History: Past Medical History:  Diagnosis Date  . Panic attack   . RA (rheumatoid arthritis) (HCC)     Medications:  Scheduled:  . amiodarone  150 mg Intravenous Once  . aspirin EC  81 mg Oral Daily  . bisoprolol  5 mg Oral Daily  . cholecalciferol  2,000 Units Oral Daily  . docusate sodium  100 mg Oral BID  . etanercept  50 mg Subcutaneous Weekly  . heparin  4,000 Units Intravenous Once  . loratadine  10 mg Oral Daily  . multivitamin with minerals  1 tablet Oral Daily    Assessment: Patient admitted for crushing chest pain w/ trops 0.25 >> 4.38, was given ASA and nitro in ED then had some runs of Vtach, then was placed on amio Patient is not on anticoagulation PTA Patient is being started on heparin drip for NSTEMI  Goal of Therapy:  Heparin level 0.3-0.7 units/ml Monitor platelets by anticoagulation protocol: Yes   Plan:  Give 4000 units bolus x 1  Will start heparin drip @ 1000 units/hr  Will check HL @ 1300 Will monitor and adjust per HL's Will monitor daily CBC's  Tobie Lords, PharmD, BCPS Clinical Pharmacist 05/01/2017

## 2017-05-01 NOTE — Progress Notes (Signed)
CCMD called to report  11 beat run of v. Tach/ pt asymptomatic/  Gerald Stabs, NP made aware/ orders to restart amio gtt/ will continue to monitor.

## 2017-05-01 NOTE — Progress Notes (Signed)
ANTICOAGULATION CONSULT NOTE - Initial Consult  Pharmacy Consult for heparin Indication: chest pain/ACS  No Known Allergies  Patient Measurements: Height: 5' 9.5" (176.5 cm) Weight: 177 lb 1.6 oz (80.3 kg) IBW/kg (Calculated) : 67.35 Heparin Dosing Weight: 80 kg  Vital Signs: Temp: 98.3 F (36.8 C) (01/17 0518) Temp Source: Oral (01/17 0518) BP: 127/80 (01/17 1155) Pulse Rate: 62 (01/17 1155)  Labs: Recent Labs    04/30/17 2208 05/01/17 0412 05/01/17 0609 05/01/17 1053 05/01/17 1250  HGB 12.0 12.4  --   --   --   HCT 36.2 38.2  --   --   --   PLT 287 290  --   --   --   APTT  --   --  29  --   --   LABPROT  --   --  13.2  --   --   INR  --   --  1.01  --   --   HEPARINUNFRC  --   --   --   --  0.28*  CREATININE 0.79 0.64  --   --   --   TROPONINI 0.25* 4.38*  --  3.60*  --     Estimated Creatinine Clearance: 96.5 mL/min (by C-G formula based on SCr of 0.64 mg/dL).   Medical History: Past Medical History:  Diagnosis Date  . Essential hypertension   . Panic attack   . RA (rheumatoid arthritis) (HCC)     Medications:  Scheduled:  . amiodarone  150 mg Intravenous Once  . aspirin EC  81 mg Oral Daily  . atorvastatin  80 mg Oral q1800  . bisoprolol  5 mg Oral Daily  . cholecalciferol  2,000 Units Oral Daily  . docusate sodium  100 mg Oral BID  . etanercept  50 mg Subcutaneous Weekly  . heparin  1,200 Units Intravenous Once  . loratadine  10 mg Oral Daily  . losartan  25 mg Oral Daily  . multivitamin with minerals  1 tablet Oral Daily  . sodium chloride flush  3 mL Intravenous Q12H    Assessment: Patient admitted for crushing chest pain w/ trops 0.25 >> 4.38, was given ASA and nitro in ED then had some runs of Vtach, then was placed on amio Patient is not on anticoagulation PTA Patient is being started on heparin drip for NSTEMI  Goal of Therapy:  Heparin level 0.3-0.7 units/ml Monitor platelets by anticoagulation protocol: Yes   Plan:  Heparin  level slightly low at 0.28. Will bolus 1200 units and increase to 1150 units/hr. Recheck in 6 hours  Brewer Hitchman D Urijah Raynor, Pharm.D, BCPS Clinical Pharmacist  05/01/2017

## 2017-05-01 NOTE — Plan of Care (Signed)
  Progressing Education: Knowledge of General Education information will improve 05/01/2017 0618 - Progressing by Loran Senters, RN Education: Understanding of cardiac disease, CV risk reduction, and recovery process will improve 05/01/2017 0618 - Progressing by Loran Senters, RN Cardiac: Ability to achieve and maintain adequate cardiovascular perfusion will improve 05/01/2017 0618 - Progressing by Loran Senters, RN

## 2017-05-01 NOTE — H&P (Signed)
[place] [facility] [place] NAME: Nicole Zuniga    MR#:  [id number]  DATE OF BIRTH:  Oct 26, 1975  DATE OF ADMISSION:  04/30/2017  PRIMARY CARE PHYSICIAN: Katheren Shams   REQUESTING/REFERRING PHYSICIAN:   CHIEF COMPLAINT:   Chief Complaint  Patient presents with  . Chest Pain    HISTORY OF PRESENT ILLNESS: Nicole Zuniga  is a 42 y.o. female with a known history of rheumatoid arthritis since childhood and panic attacks. The patient was brought to emergency room for acute onset of severe retrosternal chest pain, described as 8 out of 10 pressure, without radiation.  She recalls palpitations during the chest pain episode. The pain started while patient was at rest, watching TV and lasted approximately 20 minutes.  Patient did not notice any alleviating or aggravating factors for the pain.  She denies using any new medications or drugs, no recent unusual stress, or heavy physical activity.  Troponin level was slightly elevated, at 0.25 in the emergency room.  Telemetry monitor showed sustained V. Tach, for which she was started on amiodarone IV.  Patient currently, feels better, asymptomatic.  PAST MEDICAL HISTORY:   Past Medical History:  Diagnosis Date  . Panic attack   . RA (rheumatoid arthritis) ([place])     PAST SURGICAL HISTORY:  Past Surgical History:  Procedure Laterality Date  . KNEE SURGERY    . NECK SURGERY      SOCIAL HISTORY:  Social History   Tobacco Use  . Smoking status: Never Smoker  . Smokeless tobacco: Never Used  Substance Use Topics  . Alcohol use: No    Frequency: Never    FAMILY HISTORY: History reviewed. No pertinent family history.  DRUG ALLERGIES: No Known Allergies  REVIEW OF SYSTEMS:   CONSTITUTIONAL: No fever, fatigue or weakness.  EYES: No blurred or double vision.  EARS, NOSE, AND THROAT: No tinnitus or ear pain.  RESPIRATORY: No cough, shortness of breath, wheezing or hemoptysis.   CARDIOVASCULAR: Positive for chest pain and palpitations.  No orthopnea, edema.  GASTROINTESTINAL: No nausea, vomiting, diarrhea or abdominal pain.  GENITOURINARY: No dysuria, hematuria.  ENDOCRINE: No polyuria, nocturia,  HEMATOLOGY: No anemia, easy bruising or bleeding SKIN: No rash or lesion. MUSCULOSKELETAL: Positive history of rheumatoid arthritis.   NEUROLOGIC: No tingling, numbness, weakness.  PSYCHIATRY: No anxiety or depression.   MEDICATIONS AT HOME:  Prior to Admission medications   Medication Sig Start Date End Date Taking? Authorizing Provider  ALPRAZolam Duanne Moron) 0.25 MG tablet Take 0.25 mg by mouth at bedtime as needed for anxiety.  04/23/17  Yes [provider]  bisoprolol (ZEBETA) 5 MG tablet Take 5 mg by mouth daily. 04/03/17  Yes [provider]  Cholecalciferol (VITAMIN D) 2000 units tablet Take 2,000 Units by mouth daily.   Yes [provider]  ENBREL SURECLICK 50 MG/ML injection Inject 50 mg into the skin once a week. Takes on the weekend 04/26/17  Yes [provider]  levocetirizine (XYZAL) 5 MG tablet Take 5 mg by mouth daily. 03/21/16  Yes [provider]  Multiple Vitamin (MULTI-VITAMINS) TABS Take 1 tablet by mouth daily. 03/03/07  Yes [provider]      PHYSICAL EXAMINATION:   VITAL SIGNS: Blood pressure (!) 170/96, pulse 66, temperature 97.9 F (36.6 C), temperature source Oral, resp. rate 17, height 5' 9.5" (1.765 m), weight 80.3 kg (177 lb 1.6 oz), last menstrual period 04/30/2017, SpO2 100 %.  GENERAL:  42 y.o.-year-old patient  lying in the bed with no acute distress.  EYES: Pupils equal, round, reactive to light and accommodation. No scleral icterus. Extraocular muscles intact.  HEENT: Head atraumatic, normocephalic. Oropharynx and nasopharynx clear.  NECK:  Supple, no jugular venous distention. No thyroid enlargement, no tenderness.  LUNGS: Normal breath sounds bilaterally, no wheezing, rales,rhonchi  or crepitation. No use of accessory muscles of respiration.  CARDIOVASCULAR: S1, S2 normal. No murmurs, rubs, or gallops.  ABDOMEN: Soft, nontender, nondistended. Bowel sounds present. No organomegaly or mass.  EXTREMITIES: No pedal edema, cyanosis, or clubbing.  NEUROLOGIC: Cranial nerves II through XII are intact. Muscle strength 5/5 in all extremities. Sensation intact. Gait not checked.  PSYCHIATRIC: The patient is alert and oriented x 3.  SKIN: No obvious rash, lesion, or ulcer.   LABORATORY PANEL:   CBC Recent Labs  Lab 04/30/17 2208  WBC 6.6  HGB 12.0  HCT 36.2  PLT 287  MCV 88.5  MCH 29.4  MCHC 33.2  RDW 13.3   ------------------------------------------------------------------------------------------------------------------  Chemistries  Recent Labs  Lab 04/30/17 2208  NA 138  K 3.7  CL 102  CO2 28  GLUCOSE 102*  BUN 20  CREATININE 0.79  CALCIUM 9.4   ------------------------------------------------------------------------------------------------------------------ estimated creatinine clearance is 96.5 mL/min (by C-G formula based on SCr of 0.79 mg/dL). ------------------------------------------------------------------------------------------------------------------ No results for input(s): TSH, T4TOTAL, T3FREE, THYROIDAB in the last 72 hours.  Invalid input(s): FREET3   Coagulation profile No results for input(s): INR, PROTIME in the last 168 hours. ------------------------------------------------------------------------------------------------------------------- No results for input(s): DDIMER in the last 72 hours. -------------------------------------------------------------------------------------------------------------------  Cardiac Enzymes Recent Labs  Lab 04/30/17 2208  TROPONINI 0.25*   ------------------------------------------------------------------------------------------------------------------ Invalid input(s):  POCBNP  ---------------------------------------------------------------------------------------------------------------  Urinalysis No results found for: COLORURINE, APPEARANCEUR, LABSPEC, PHURINE, GLUCOSEU, HGBUR, BILIRUBINUR, KETONESUR, PROTEINUR, UROBILINOGEN, NITRITE, LEUKOCYTESUR   RADIOLOGY: Dg Chest 2 View  Result Date: 04/30/2017 CLINICAL DATA:  Chest pain EXAM: CHEST  2 VIEW COMPARISON:  None. FINDINGS: The heart size and mediastinal contours are within normal limits. Both lungs are clear. The visualized skeletal structures are unremarkable. IMPRESSION: No active cardiopulmonary disease. Electronically Signed   By: Ulyses Jarred M.D.   On: 04/30/2017 22:38    EKG: Orders placed or performed during the hospital encounter of 04/30/17  . EKG 12-Lead  . EKG 12-Lead  . ED EKG within 10 minutes  . ED EKG within 10 minutes    IMPRESSION AND PLAN:  1.  Sustained V. Tach, currently well controlled on amiodarone IV.  We will continue telemetry monitor.  We will check 2D echo. Cardiology was consulted. 2.  Chest pain, likely related to V. tach episodes, currently resolved.  We will continue to monitor clinically closely. 3.  Non-ST elevation MI, elevated troponin level at 0.25.  We will follow the troponin level.  Patient started on aspirin.  4.  Rheumatoid arthritis, stable on Enbrel.   All the records are reviewed and case discussed with ED provider. Management plans discussed with the patient, family and they are in agreement.  CODE STATUS:    Code Status Orders  (From admission, onward)        Start     Ordered   05/01/17 0228  Full code  Continuous     05/01/17 0228    Code Status History    Date Active Date Inactive Code Status Order ID Comments User Context   This patient has a current code status but no historical code status.       TOTAL TIME TAKING  CARE OF THIS PATIENT: 35 minutes.    Amelia Jo M.D on 05/01/2017 at 4:36 AM  Between 7am to 6pm - Pager  - [phone]  After 6pm go to [email or web address] - password [email or web address] [facility]  [facility]  Office  [phone]  CC: Primary care physician; Katheren Shams

## 2017-05-02 ENCOUNTER — Encounter
Admission: EM | Disposition: A | Discharge status: Home / Self Care | Payer: Self-pay | Source: Home / Self Care | Attending: Family Medicine

## 2017-05-02 DIAGNOSIS — I2510 Atherosclerotic heart disease of native coronary artery without angina pectoris: Secondary | ICD-10-CM

## 2017-05-02 HISTORY — PX: LEFT HEART CATH AND CORONARY ANGIOGRAPHY: CATH118249

## 2017-05-02 LAB — CBC
HCT: 39.8 % (ref 35.0–47.0)
Hemoglobin: 13 g/dL (ref 12.0–16.0)
MCH: 29.1 pg (ref 26.0–34.0)
MCHC: 32.6 g/dL (ref 32.0–36.0)
MCV: 89.3 fL (ref 80.0–100.0)
Platelets: 287 10*3/uL (ref 150–440)
RBC: 4.46 MIL/uL (ref 3.80–5.20)
RDW: 13.3 % (ref 11.5–14.5)
WBC: 7 10*3/uL (ref 3.6–11.0)

## 2017-05-02 LAB — GLUCOSE, CAPILLARY: Glucose-Capillary: 94 mg/dL (ref 65–99)

## 2017-05-02 LAB — HIV ANTIBODY (ROUTINE TESTING W REFLEX): HIV Screen 4th Generation wRfx: NONREACTIVE

## 2017-05-02 LAB — HEPARIN LEVEL (UNFRACTIONATED): Heparin Unfractionated: 0.51 IU/mL (ref 0.30–0.70)

## 2017-05-02 SURGERY — LEFT HEART CATH AND CORONARY ANGIOGRAPHY
Anesthesia: Moderate Sedation

## 2017-05-02 MED ORDER — SODIUM CHLORIDE 0.9% FLUSH: 3.0000 mL | INTRAVENOUS | Status: DC | PRN

## 2017-05-02 MED ORDER — ATORVASTATIN CALCIUM 20 MG PO TABS
20.0000 mg | ORAL_TABLET | Freq: Every day | ORAL | Status: DC
  Administered 2017-05-02: 20 mg via ORAL
  Filled 2017-05-02: qty 1

## 2017-05-02 MED ORDER — FENTANYL CITRATE (PF) 100 MCG/2ML IJ SOLN
INTRAMUSCULAR | Status: DC | PRN
  Administered 2017-05-02: 50 ug via INTRAVENOUS

## 2017-05-02 MED ORDER — SODIUM CHLORIDE 0.9 % IV SOLN: 250.0000 mL | INTRAVENOUS | Status: DC | PRN

## 2017-05-02 MED ORDER — MIDAZOLAM HCL 2 MG/2ML IJ SOLN
INTRAMUSCULAR | Status: AC
  Filled 2017-05-02: qty 2

## 2017-05-02 MED ORDER — VERAPAMIL HCL 2.5 MG/ML IV SOLN
INTRAVENOUS | Status: AC
  Filled 2017-05-02: qty 2

## 2017-05-02 MED ORDER — HEPARIN SODIUM (PORCINE) 1000 UNIT/ML IJ SOLN
INTRAMUSCULAR | Status: AC
  Filled 2017-05-02: qty 1

## 2017-05-02 MED ORDER — SODIUM CHLORIDE 0.9 % IV SOLN: INTRAVENOUS | Status: AC

## 2017-05-02 MED ORDER — HEPARIN (PORCINE) IN NACL 2-0.9 UNIT/ML-% IJ SOLN
INTRAMUSCULAR | Status: AC
  Filled 2017-05-02: qty 500

## 2017-05-02 MED ORDER — ASPIRIN 81 MG PO CHEW: 81.0000 mg | CHEWABLE_TABLET | ORAL | Status: DC

## 2017-05-02 MED ORDER — METOPROLOL TARTRATE 25 MG PO TABS: 12.5000 mg | ORAL_TABLET | Freq: Two times a day (BID) | ORAL | Status: DC

## 2017-05-02 MED ORDER — SODIUM CHLORIDE 0.9% FLUSH
3.0000 mL | Freq: Two times a day (BID) | INTRAVENOUS | Status: DC
  Administered 2017-05-02 – 2017-05-03 (×2): 3 mL via INTRAVENOUS

## 2017-05-02 MED ORDER — IOPAMIDOL (ISOVUE-300) INJECTION 61%
INTRAVENOUS | Status: DC | PRN
  Administered 2017-05-02: 110 mL via INTRAVENOUS

## 2017-05-02 MED ORDER — SODIUM CHLORIDE 0.9 % WEIGHT BASED INFUSION: 3.0000 mL/kg/h | INTRAVENOUS | Status: DC

## 2017-05-02 MED ORDER — FENTANYL CITRATE (PF) 100 MCG/2ML IJ SOLN
INTRAMUSCULAR | Status: AC
  Filled 2017-05-02: qty 2

## 2017-05-02 MED ORDER — SODIUM CHLORIDE 0.9% FLUSH
3.0000 mL | Freq: Two times a day (BID) | INTRAVENOUS | Status: DC
  Administered 2017-05-02: 3 mL via INTRAVENOUS

## 2017-05-02 MED ORDER — MIDAZOLAM HCL 2 MG/2ML IJ SOLN
INTRAMUSCULAR | Status: DC | PRN
  Administered 2017-05-02: 1 mg via INTRAVENOUS

## 2017-05-02 MED ORDER — SODIUM CHLORIDE 0.9 % WEIGHT BASED INFUSION
1.0000 mL/kg/h | INTRAVENOUS | Status: DC
  Administered 2017-05-02: 1 mL/kg/h via INTRAVENOUS

## 2017-05-02 MED ORDER — NITROGLYCERIN 1 MG/10 ML FOR IR/CATH LAB
INTRA_ARTERIAL | Status: DC | PRN
  Administered 2017-05-02: 200 ug via INTRACORONARY

## 2017-05-02 MED ORDER — NITROGLYCERIN 5 MG/ML IV SOLN
INTRAVENOUS | Status: AC
  Filled 2017-05-02: qty 10

## 2017-05-02 SURGICAL SUPPLY — 13 items
CATH INFINITI 5FR ANG PIGTAIL (CATHETERS) ×2 IMPLANT
CATH INFINITI 5FR JL4 (CATHETERS) IMPLANT
CATH INFINITI JR4 5F (CATHETERS) IMPLANT
CATH OPTITORQUE JACKY 4.0 5F (CATHETERS) ×2 IMPLANT
DEVICE RAD TR BAND REGULAR (VASCULAR PRODUCTS) ×2 IMPLANT
GLIDESHEATH SLEND SS 6F .021 (SHEATH) ×4 IMPLANT
KIT MANI 3VAL PERCEP (MISCELLANEOUS) ×2 IMPLANT
NEEDLE PERC 18GX7CM (NEEDLE) IMPLANT
PACK CARDIAC CATH (CUSTOM PROCEDURE TRAY) ×2 IMPLANT
SHEATH PINNACLE 5F 10CM (SHEATH) IMPLANT
WIRE EMERALD 3MM-J .035X150CM (WIRE) IMPLANT
WIRE HITORQ VERSACORE ST 145CM (WIRE) ×2 IMPLANT
WIRE ROSEN-J .035X260CM (WIRE) ×2 IMPLANT

## 2017-05-02 NOTE — Progress Notes (Signed)
ANTICOAGULATION CONSULT NOTE - Initial Consult  Pharmacy Consult for heparin Indication: chest pain/ACS  No Known Allergies  Patient Measurements: Height: 5' 9.5" (176.5 cm) Weight: 177 lb 1.6 oz (80.3 kg) IBW/kg (Calculated) : 67.35 Heparin Dosing Weight: 80 kg  Vital Signs: Temp: 98.2 F (36.8 C) (01/17 2059) Temp Source: Oral (01/17 2059) BP: 122/73 (01/17 2059) Pulse Rate: 64 (01/17 2059)  Labs: Recent Labs    04/30/17 2208 05/01/17 0412 05/01/17 0609 05/01/17 1053 05/01/17 1250 05/01/17 1627 05/01/17 1951 05/02/17 0208  HGB 12.0 12.4  --   --   --   --   --  13.0  HCT 36.2 38.2  --   --   --   --   --  39.8  PLT 287 290  --   --   --   --   --  287  APTT  --   --  29  --   --   --   --   --   LABPROT  --   --  13.2  --   --   --   --   --   INR  --   --  1.01  --   --   --   --   --   HEPARINUNFRC  --   --   --   --  0.28*  --  0.48 0.51  CREATININE 0.79 0.64  --   --   --   --   --   --   TROPONINI 0.25* 4.38*  --  3.60*  --  2.47*  --   --     Estimated Creatinine Clearance: 96.5 mL/min (by C-G formula based on SCr of 0.64 mg/dL).   Medical History: Past Medical History:  Diagnosis Date  . Essential hypertension   . Panic attack   . RA (rheumatoid arthritis) (HCC)     Medications:  Scheduled:  . amiodarone  150 mg Intravenous Once  . aspirin EC  81 mg Oral Daily  . atorvastatin  80 mg Oral q1800  . bisoprolol  5 mg Oral Daily  . cholecalciferol  2,000 Units Oral Daily  . docusate sodium  100 mg Oral BID  . etanercept  50 mg Subcutaneous Weekly  . loratadine  10 mg Oral Daily  . losartan  25 mg Oral Daily  . multivitamin with minerals  1 tablet Oral Daily  . sodium chloride flush  3 mL Intravenous Q12H  . temazepam  15 mg Oral QHS    Assessment: Patient admitted for crushing chest pain w/ trops 0.25 >> 4.38, was given ASA and nitro in ED then had some runs of Vtach, then was placed on amio Patient is not on anticoagulation PTA Patient is  being started on heparin drip for NSTEMI  Goal of Therapy:  Heparin level 0.3-0.7 units/ml Monitor platelets by anticoagulation protocol: Yes   Plan:  HL = 0.48 is therapeutic. Continue heparin drip at current rate of 1150 units/hr and order confirmatory HL in 6 hours. CBC with AM labs tomorrow.  01/18 @ 0200 HL 0.51 therapeutic. Will continue current rate and will recheck w/ am labs. CBC stable  Tobie Lords, Pharm.D, BCPS Clinical Pharmacist 05/02/2017

## 2017-05-02 NOTE — Progress Notes (Signed)
Cardiac cath  By Dr. Fletcher Anon   Mid Cx lesion is 50% stenosed.  The left ventricular systolic function is normal.  LV end diastolic pressure is mildly elevated.  The left ventricular ejection fraction is 55-65% by visual estimate.   1.  Moderate one-vessel coronary artery disease with 50% stenosis in the mid left circumflex that has an appearance that is suggestive but not conclusive of spontaneous coronary artery dissection. 2.  Normal LV systolic function and mildly elevated left ventricular end-diastolic pressure.  Recommendations: Low-dose aspirin and a beta-blocker.   Monitor in the hospital for another day and possible discharge home tomorrow if no recurrent angina or ventricular arrhythmia.

## 2017-05-02 NOTE — Interval H&P Note (Signed)
History and Physical Interval Note:  05/02/2017 12:23 PM  Nicole Zuniga  has presented today for surgery, with the diagnosis of nstemi  The various methods of treatment have been discussed with the patient and family. After consideration of risks, benefits and other options for treatment, the patient has consented to  Procedure(s): LEFT HEART CATH AND CORONARY ANGIOGRAPHY (N/A) as a surgical intervention .  The patient's history has been reviewed, patient examined, no change in status, stable for surgery.  I have reviewed the patient's chart and labs.  Questions were answered to the patient's satisfaction.     [name]

## 2017-05-02 NOTE — H&P (View-Only) (Signed)
Progress Note  Patient Name: Nicole Zuniga Date of Encounter: 05/02/2017  Primary Cardiologist: Ida Rogue, MD  Subjective   No chest pain, dyspnea, or palpitations overnight.  No further VT.  For cath today.  Inpatient Medications    Scheduled Meds: . amiodarone  150 mg Intravenous Once  . aspirin EC  81 mg Oral Daily  . atorvastatin  80 mg Oral q1800  . bisoprolol  5 mg Oral Daily  . cholecalciferol  2,000 Units Oral Daily  . docusate sodium  100 mg Oral BID  . etanercept  50 mg Subcutaneous Weekly  . loratadine  10 mg Oral Daily  . losartan  25 mg Oral Daily  . multivitamin with minerals  1 tablet Oral Daily  . sodium chloride flush  3 mL Intravenous Q12H  . temazepam  15 mg Oral QHS   Continuous Infusions: . amiodarone 30 mg/hr (05/01/17 2336)  . heparin 1,150 Units/hr (05/01/17 2339)   PRN Meds: acetaminophen **OR** acetaminophen, ALPRAZolam, bisacodyl, HYDROcodone-acetaminophen, ondansetron **OR** ondansetron (ZOFRAN) IV   Vital Signs    Vitals:   05/01/17 1734 05/01/17 2059 05/02/17 0524 05/02/17 0734  BP:  122/73 126/72 139/82  Pulse: (!) 109 64 (!) 57 (!) 58  Resp:  18 18   Temp:  98.2 F (36.8 C) 97.9 F (36.6 C) 98.4 F (36.9 C)  TempSrc:  Oral Oral Oral  SpO2:  100% 100% 98%  Weight:   173 lb 11.2 oz (78.8 kg)   Height:        Intake/Output Summary (Last 24 hours) at 05/02/2017 0744 Last data filed at 05/02/2017 0610 Gross per 24 hour  Intake 846.59 ml  Output 400 ml  Net 446.59 ml   Filed Weights   04/30/17 2205 05/01/17 0225 05/02/17 0524  Weight: 174 lb (78.9 kg) 177 lb 1.6 oz (80.3 kg) 173 lb 11.2 oz (78.8 kg)    Physical Exam   GEN: Well nourished, well developed, in no acute distress.  HEENT: Grossly normal.  Neck: Supple, no JVD, carotid bruits, or masses. Cardiac: RRR, no murmurs, rubs, or gallops. No clubbing, cyanosis, edema.  Radials/DP/PT 2+ and equal bilaterally.  Respiratory:  Respirations regular and unlabored,  clear to auscultation bilaterally. GI: Soft, nontender, nondistended, BS + x 4. MS: no deformity or atrophy. Skin: warm and dry, no rash. Neuro:  Strength and sensation are intact. Psych: AAOx3.  Normal affect.  Labs    Chemistry Recent Labs  Lab 04/30/17 2208 05/01/17 0412  NA 138 139  K 3.7 3.6  CL 102 104  CO2 28 27  GLUCOSE 102* 108*  BUN 20 17  CREATININE 0.79 0.64  CALCIUM 9.4 9.6  GFRNONAA >60 >60  GFRAA >60 >60  ANIONGAP 8 8     Hematology Recent Labs  Lab 04/30/17 2208 05/01/17 0412 05/02/17 0208  WBC 6.6 6.0 7.0  RBC 4.10 4.29 4.46  HGB 12.0 12.4 13.0  HCT 36.2 38.2 39.8  MCV 88.5 89.0 89.3  MCH 29.4 28.9 29.1  MCHC 33.2 32.5 32.6  RDW 13.3 13.5 13.3  PLT 287 290 287    Cardiac Enzymes Recent Labs  Lab 04/30/17 2208 05/01/17 0412 05/01/17 1053 05/01/17 1627  TROPONINI 0.25* 4.38* 3.60* 2.47*     Radiology    Dg Chest 2 View  Result Date: 04/30/2017 CLINICAL DATA:  Chest pain EXAM: CHEST  2 VIEW COMPARISON:  None. FINDINGS: The heart size and mediastinal contours are within normal limits. Both lungs are clear. The  visualized skeletal structures are unremarkable. IMPRESSION: No active cardiopulmonary disease. Electronically Signed   By: Ulyses Jarred M.D.   On: 04/30/2017 22:38    Telemetry    Sinus brady, RSR - Personally Reviewed  Cardiac Studies   2D Echocardiogram 1.17.2018  Study Conclusions   - Left ventricle: The cavity size was normal. Systolic function was   normal. The estimated ejection fraction was in the range of 60%   to 65%. Wall motion was normal; there were no regional wall   motion abnormalities. Left ventricular diastolic function   parameters were normal. - Left atrium: The atrium was normal in size. - Right ventricle: Systolic function was normal. - Pulmonary arteries: Systolic pressure was within the normal   range.   Patient Profile     42 y.o. female w/ a h/o RA, anxiety, and panic attacks, admitted  1/16 with chest pain and NSTEMI - trop peaked @ 4.38. Echo 1/17 showed nl EF w/o wma's.  Assessment & Plan    1.  NSTEMI:  Pt w/o prior cardiac hx. + FH for premature CAD (father s/p CABG in his 20's). Admitted 1/16 with chest pain and troponin elevation to 4.38.  Now trending down.  Also w/ NSVT in ED req IV amio.  No further chest pain.  VT quiescent.  Echo showed nl EF.  ? SCAD. Plan on diagnostic cath this AM.  The patient understands that risks include but are not limited to stroke (1 in 1000), death (1 in 1000), kidney failure [usually temporary] (1 in 500), bleeding (1 in 200), allergic reaction [possibly serious] (1 in 200), and agrees to proceed. Cont asa, statin,  blocker, ARB.  Further recs pending cath.  2.  NSVT:  In setting of above.  Nl EF by echo.  Lytes wnl.  Quiescent on IV amio - will d/c this AM.  Cont  blocker.  Cath pending.  If cath clean, will plan on event monitor to further assess for burden of NSVT.  3.  Essential HTN:  BP trending better following addition of ARB.  Cont  blocker.  4.  Anxiety w/ h/o panic attacks:  Stable.  Uses prn xanax @ home.  5.  RA:  On etanercept.    Signed, Murray Hodgkins, NP  05/02/2017, 7:44 AM    For questions or updates, please contact   Please consult [email or web address] for contact info under Cardiology/STEMI.   Attending Note Patient seen and examined, agree with detailed note above,  Patient presentation and plan discussed on rounds.   No events overnight, No nonsustained VT on telemetry Anxious concerning cardiac catheterization today No further chest pain Troponin trending downward Amiodarone infusion to be held this morning  On physical exam no JVP, lungs clear to auscultation bilaterally heart sounds regular with normal S1-S2 no murmurs appreciated abdomen soft nontender no significant lower extremity edema  Troponin down to 2.47, hematocrit 39.8, WBC 7, creatinine 0.64  1) NSTEMI Etiology unclear Unable to rule  out coronary dissection, stress cardiomyopathy Scheduled for cardiac catheterization today with Dr. Fletcher Anon Unable to perform yesterday secondary to scheduling issues, and patient preference for radial access -Essentially normal echocardiogram, EF >55% I have reviewed the risks, indications, and alternatives to cardiac catheterization, possible angioplasty, and stenting with the patient. Risks include but are not limited to bleeding, infection, vascular injury, stroke, myocardial infection, arrhythmia, kidney injury, radiation-related injury in the case of prolonged fluoroscopy use, emergency cardiac surgery, and death. The patient understands the risks of serious complication is  1-2 in 1000 with diagnostic cardiac cath and 1-2% or less with angioplasty/stenting.   2) NSVT Etiology unclear, On amiodarone none in the past 24 hours, last was yesterday morning We will hold amiodarone infusion, continue beta-blocker She may benefit from outpatient 30-day monitor  3) essential hypertension Continue beta blocker  4) rheumatoid arthritis Stable, on etanercept  Greater than 50% was spent in counseling and coordination of care with patient Total encounter time 25 minutes or more   Signed: Esmond Plants  M.D., Ph.D. [facility]

## 2017-05-02 NOTE — Progress Notes (Signed)
Progress Note  Patient Name: Nicole Zuniga Date of Encounter: 05/02/2017  Primary Cardiologist: Ida Rogue, MD  Subjective   No chest pain, dyspnea, or palpitations overnight.  No further VT.  For cath today.  Inpatient Medications    Scheduled Meds: . amiodarone  150 mg Intravenous Once  . aspirin EC  81 mg Oral Daily  . atorvastatin  80 mg Oral q1800  . bisoprolol  5 mg Oral Daily  . cholecalciferol  2,000 Units Oral Daily  . docusate sodium  100 mg Oral BID  . etanercept  50 mg Subcutaneous Weekly  . loratadine  10 mg Oral Daily  . losartan  25 mg Oral Daily  . multivitamin with minerals  1 tablet Oral Daily  . sodium chloride flush  3 mL Intravenous Q12H  . temazepam  15 mg Oral QHS   Continuous Infusions: . amiodarone 30 mg/hr (05/01/17 2336)  . heparin 1,150 Units/hr (05/01/17 2339)   PRN Meds: acetaminophen **OR** acetaminophen, ALPRAZolam, bisacodyl, HYDROcodone-acetaminophen, ondansetron **OR** ondansetron (ZOFRAN) IV   Vital Signs    Vitals:   05/01/17 1734 05/01/17 2059 05/02/17 0524 05/02/17 0734  BP:  122/73 126/72 139/82  Pulse: (!) 109 64 (!) 57 (!) 58  Resp:  18 18   Temp:  98.2 F (36.8 C) 97.9 F (36.6 C) 98.4 F (36.9 C)  TempSrc:  Oral Oral Oral  SpO2:  100% 100% 98%  Weight:   173 lb 11.2 oz (78.8 kg)   Height:        Intake/Output Summary (Last 24 hours) at 05/02/2017 0744 Last data filed at 05/02/2017 0610 Gross per 24 hour  Intake 846.59 ml  Output 400 ml  Net 446.59 ml   Filed Weights   04/30/17 2205 05/01/17 0225 05/02/17 0524  Weight: 174 lb (78.9 kg) 177 lb 1.6 oz (80.3 kg) 173 lb 11.2 oz (78.8 kg)    Physical Exam   GEN: Well nourished, well developed, in no acute distress.  HEENT: Grossly normal.  Neck: Supple, no JVD, carotid bruits, or masses. Cardiac: RRR, no murmurs, rubs, or gallops. No clubbing, cyanosis, edema.  Radials/DP/PT 2+ and equal bilaterally.  Respiratory:  Respirations regular and unlabored,  clear to auscultation bilaterally. GI: Soft, nontender, nondistended, BS + x 4. MS: no deformity or atrophy. Skin: warm and dry, no rash. Neuro:  Strength and sensation are intact. Psych: AAOx3.  Normal affect.  Labs    Chemistry Recent Labs  Lab 04/30/17 2208 05/01/17 0412  NA 138 139  K 3.7 3.6  CL 102 104  CO2 28 27  GLUCOSE 102* 108*  BUN 20 17  CREATININE 0.79 0.64  CALCIUM 9.4 9.6  GFRNONAA >60 >60  GFRAA >60 >60  ANIONGAP 8 8     Hematology Recent Labs  Lab 04/30/17 2208 05/01/17 0412 05/02/17 0208  WBC 6.6 6.0 7.0  RBC 4.10 4.29 4.46  HGB 12.0 12.4 13.0  HCT 36.2 38.2 39.8  MCV 88.5 89.0 89.3  MCH 29.4 28.9 29.1  MCHC 33.2 32.5 32.6  RDW 13.3 13.5 13.3  PLT 287 290 287    Cardiac Enzymes Recent Labs  Lab 04/30/17 2208 05/01/17 0412 05/01/17 1053 05/01/17 1627  TROPONINI 0.25* 4.38* 3.60* 2.47*     Radiology    Dg Chest 2 View  Result Date: 04/30/2017 CLINICAL DATA:  Chest pain EXAM: CHEST  2 VIEW COMPARISON:  None. FINDINGS: The heart size and mediastinal contours are within normal limits. Both lungs are clear. The  visualized skeletal structures are unremarkable. IMPRESSION: No active cardiopulmonary disease. Electronically Signed   By: Ulyses Jarred M.D.   On: 04/30/2017 22:38    Telemetry    Sinus brady, RSR - Personally Reviewed  Cardiac Studies   2D Echocardiogram 1.17.2018  Study Conclusions   - Left ventricle: The cavity size was normal. Systolic function was   normal. The estimated ejection fraction was in the range of 60%   to 65%. Wall motion was normal; there were no regional wall   motion abnormalities. Left ventricular diastolic function   parameters were normal. - Left atrium: The atrium was normal in size. - Right ventricle: Systolic function was normal. - Pulmonary arteries: Systolic pressure was within the normal   range.   Patient Profile     42 y.o. female w/ a h/o RA, anxiety, and panic attacks, admitted  1/16 with chest pain and NSTEMI - trop peaked @ 4.38. Echo 1/17 showed nl EF w/o wma's.  Assessment & Plan    1.  NSTEMI:  Pt w/o prior cardiac hx. + FH for premature CAD (father s/p CABG in his 33's). Admitted 1/16 with chest pain and troponin elevation to 4.38.  Now trending down.  Also w/ NSVT in ED req IV amio.  No further chest pain.  VT quiescent.  Echo showed nl EF.  ? SCAD. Plan on diagnostic cath this AM.  The patient understands that risks include but are not limited to stroke (1 in 1000), death (1 in 1000), kidney failure [usually temporary] (1 in 500), bleeding (1 in 200), allergic reaction [possibly serious] (1 in 200), and agrees to proceed. Cont asa, statin,  blocker, ARB.  Further recs pending cath.  2.  NSVT:  In setting of above.  Nl EF by echo.  Lytes wnl.  Quiescent on IV amio - will d/c this AM.  Cont  blocker.  Cath pending.  If cath clean, will plan on event monitor to further assess for burden of NSVT.  3.  Essential HTN:  BP trending better following addition of ARB.  Cont  blocker.  4.  Anxiety w/ h/o panic attacks:  Stable.  Uses prn xanax @ home.  5.  RA:  On etanercept.    Signed, Murray Hodgkins, NP  05/02/2017, 7:44 AM    For questions or updates, please contact   Please consult [email or web address] for contact info under Cardiology/STEMI.   Attending Note Patient seen and examined, agree with detailed note above,  Patient presentation and plan discussed on rounds.   No events overnight, No nonsustained VT on telemetry Anxious concerning cardiac catheterization today No further chest pain Troponin trending downward Amiodarone infusion to be held this morning  On physical exam no JVP, lungs clear to auscultation bilaterally heart sounds regular with normal S1-S2 no murmurs appreciated abdomen soft nontender no significant lower extremity edema  Troponin down to 2.47, hematocrit 39.8, WBC 7, creatinine 0.64  1) NSTEMI Etiology unclear Unable to rule  out coronary dissection, stress cardiomyopathy Scheduled for cardiac catheterization today with Dr. Fletcher Anon Unable to perform yesterday secondary to scheduling issues, and patient preference for radial access -Essentially normal echocardiogram, EF >55% I have reviewed the risks, indications, and alternatives to cardiac catheterization, possible angioplasty, and stenting with the patient. Risks include but are not limited to bleeding, infection, vascular injury, stroke, myocardial infection, arrhythmia, kidney injury, radiation-related injury in the case of prolonged fluoroscopy use, emergency cardiac surgery, and death. The patient understands the risks of serious complication is  1-2 in 1000 with diagnostic cardiac cath and 1-2% or less with angioplasty/stenting.   2) NSVT Etiology unclear, On amiodarone none in the past 24 hours, last was yesterday morning We will hold amiodarone infusion, continue beta-blocker She may benefit from outpatient 30-day monitor  3) essential hypertension Continue beta blocker  4) rheumatoid arthritis Stable, on etanercept  Greater than 50% was spent in counseling and coordination of care with patient Total encounter time 25 minutes or more   Signed: Esmond Plants  M.D., Ph.D. [facility]

## 2017-05-02 NOTE — Progress Notes (Signed)
[place] at [place] NAME: Nicole Zuniga    MR#:  [id number]  DATE OF BIRTH:  03/19/1976  SUBJECTIVE:  CHIEF COMPLAINT:   Chief Complaint  Patient presents with  . Chest Pain  Patient without complaint, no events overnight per nursing staff, case discussed with cardiology  REVIEW OF SYSTEMS:  CONSTITUTIONAL: No fever, fatigue or weakness.  EYES: No blurred or double vision.  EARS, NOSE, AND THROAT: No tinnitus or ear pain.  RESPIRATORY: No cough, shortness of breath, wheezing or hemoptysis.  CARDIOVASCULAR: No chest pain, orthopnea, edema.  GASTROINTESTINAL: No nausea, vomiting, diarrhea or abdominal pain.  GENITOURINARY: No dysuria, hematuria.  ENDOCRINE: No polyuria, nocturia,  HEMATOLOGY: No anemia, easy bruising or bleeding SKIN: No rash or lesion. MUSCULOSKELETAL: No joint pain or arthritis.   NEUROLOGIC: No tingling, numbness, weakness.  PSYCHIATRY: No anxiety or depression.   ROS  DRUG ALLERGIES:  No Known Allergies  VITALS:  Blood pressure 140/82, pulse 64, temperature 98.1 F (36.7 C), temperature source Oral, resp. rate 18, height 5' 9.5" (1.765 m), weight 78.8 kg (173 lb 11.2 oz), last menstrual period 04/30/2017, SpO2 98 %.  PHYSICAL EXAMINATION:  GENERAL:  42 y.o.-year-old patient lying in the bed with no acute distress.  EYES: Pupils equal, round, reactive to light and accommodation. No scleral icterus. Extraocular muscles intact.  HEENT: Head atraumatic, normocephalic. Oropharynx and nasopharynx clear.  NECK:  Supple, no jugular venous distention. No thyroid enlargement, no tenderness.  LUNGS: Normal breath sounds bilaterally, no wheezing, rales,rhonchi or crepitation. No use of accessory muscles of respiration.  CARDIOVASCULAR: S1, S2 normal. No murmurs, rubs, or gallops.  ABDOMEN: Soft, nontender, nondistended. Bowel sounds present. No organomegaly or mass.  EXTREMITIES: No pedal edema, cyanosis, or clubbing.   NEUROLOGIC: Cranial nerves II through XII are intact. Muscle strength 5/5 in all extremities. Sensation intact. Gait not checked.  PSYCHIATRIC: The patient is alert and oriented x 3.  SKIN: No obvious rash, lesion, or ulcer.   Physical Exam LABORATORY PANEL:   CBC Recent Labs  Lab 05/02/17 0208  WBC 7.0  HGB 13.0  HCT 39.8  PLT 287   ------------------------------------------------------------------------------------------------------------------  Chemistries  Recent Labs  Lab 05/01/17 0412 05/01/17 1053  NA 139  --   K 3.6  --   CL 104  --   CO2 27  --   GLUCOSE 108*  --   BUN 17  --   CREATININE 0.64  --   CALCIUM 9.6  --   MG  --  1.8   ------------------------------------------------------------------------------------------------------------------  Cardiac Enzymes Recent Labs  Lab 05/01/17 1053 05/01/17 1627  TROPONINI 3.60* 2.47*   ------------------------------------------------------------------------------------------------------------------  RADIOLOGY:  Dg Chest 2 View  Result Date: 04/30/2017 CLINICAL DATA:  Chest pain EXAM: CHEST  2 VIEW COMPARISON:  None. FINDINGS: The heart size and mediastinal contours are within normal limits. Both lungs are clear. The visualized skeletal structures are unremarkable. IMPRESSION: No active cardiopulmonary disease. Electronically Signed   By: Ulyses Jarred M.D.   On: 04/30/2017 22:38    ASSESSMENT AND PLAN:  1 acute non-STEMI Resolving Continue aspirin, statin therapy, beta-blocker therapy, losartan, heparin drip for 48 hours, adult pain regiment, supplemental oxygen as needed, echocardiogram was a normal study, status post heart catheterization on May 02, 2016 noted for 50% stenosis of mid circumflex, medical management per cardiology with another 24 hours of hospital monitoring,   2  acute nonsustained ventricular tachycardia Resolved Echocardiogram was a normal study  Amiodarone  discontinued, continue  beta-blocker therapy and discussion with cardiology   3 chronic rheumatoid arthritis Stable On Enbrel  4 chronic GAD, unspecified Stable Continue current regiment  5 acute insomnia Restoril at bedtime  All the records are reviewed and case discussed with Care Management/Social Workerr. Management plans discussed with the patient, family and they are in agreement.  CODE STATUS: full  TOTAL TIME TAKING CARE OF THIS PATIENT: 40 minutes.     POSSIBLE D/C IN 1-2 DAYS, DEPENDING ON CLINICAL CONDITION.   Avel Peace Emanuel Campos M.D on 05/02/2017   Between 7am to 6pm - Pager - [phone]  After 6pm go to [email or web address] - password [email or web address] [facility]  Office  [phone]  CC: Primary care physician; Katheren Shams  Note: This dictation was prepared with Dragon dictation along with smaller phrase technology. Any transcriptional errors that result from this process are unintentional.

## 2017-05-03 LAB — CBC
HCT: 39.6 % (ref 35.0–47.0)
Hemoglobin: 13.1 g/dL (ref 12.0–16.0)
MCH: 29.5 pg (ref 26.0–34.0)
MCHC: 33.1 g/dL (ref 32.0–36.0)
MCV: 89.1 fL (ref 80.0–100.0)
Platelets: 270 10*3/uL (ref 150–440)
RBC: 4.44 MIL/uL (ref 3.80–5.20)
RDW: 13.3 % (ref 11.5–14.5)
WBC: 6.9 10*3/uL (ref 3.6–11.0)

## 2017-05-03 LAB — GLUCOSE, CAPILLARY: Glucose-Capillary: 126 mg/dL — ABNORMAL HIGH (ref 65–99)

## 2017-05-03 MED ORDER — LOSARTAN POTASSIUM 25 MG PO TABS: 25.0000 mg | ORAL_TABLET | Freq: Every day | ORAL | 0 refills | Status: DC

## 2017-05-03 MED ORDER — ASPIRIN 81 MG PO TBEC: 81.0000 mg | DELAYED_RELEASE_TABLET | Freq: Every day | ORAL | 1 refills | Status: DC

## 2017-05-03 MED ORDER — ATORVASTATIN CALCIUM 20 MG PO TABS: 20.0000 mg | ORAL_TABLET | Freq: Every day | ORAL | 0 refills | Status: DC

## 2017-05-03 NOTE — Progress Notes (Signed)
Akaila D Lacock to be D/C'd Home per MD order.  Discussed prescriptions and follow up appointments with the patient. Prescriptions were e-prescribed, medication list explained in detail. Pt verbalized understanding.  Allergies as of 05/03/2017   No Known Allergies     Medication List    TAKE these medications   ALPRAZolam 0.25 MG tablet Commonly known as:  XANAX Take 0.25 mg by mouth at bedtime as needed for anxiety.   aspirin 81 MG EC tablet Take 1 tablet (81 mg total) by mouth daily. Start taking on:  05/04/2017   atorvastatin 20 MG tablet Commonly known as:  LIPITOR Take 1 tablet (20 mg total) by mouth daily at 6 PM.   bisoprolol 5 MG tablet Commonly known as:  ZEBETA Take 5 mg by mouth daily.   ENBREL SURECLICK 50 MG/ML injection Generic drug:  etanercept Inject 50 mg into the skin once a week. Takes on the weekend   levocetirizine 5 MG tablet Commonly known as:  XYZAL Take 5 mg by mouth daily.   losartan 25 MG tablet Commonly known as:  COZAAR Take 1 tablet (25 mg total) by mouth daily. Start taking on:  05/04/2017   MULTI-VITAMINS Tabs Take 1 tablet by mouth daily.   Vitamin D 2000 units tablet Take 2,000 Units by mouth daily.       Vitals:   05/03/17 0446 05/03/17 0736  BP: 135/80 138/87  Pulse: 62 60  Resp: 18 18  Temp: 98.5 F (36.9 C)   SpO2: 99% 99%    Tele box removed and returned.Skin clean, dry and intact without evidence of skin break down, no evidence of skin tears noted. IV catheter discontinued intact. Site without signs and symptoms of complications. Dressing and pressure applied. Pt denies pain at this time. No complaints noted.  An After Visit Summary was printed and given to the patient. Patient escorted via [place], and D/C home via private auto.  Rolley Sims

## 2017-05-03 NOTE — Discharge Summary (Signed)
[place] at [place] NAME: Jaquesha Boroff    MR#:  [id number]  DATE OF BIRTH:  [birth year]  DATE OF ADMISSION:  04/30/2017 ADMITTING PHYSICIAN: Amelia Jo, MD  DATE OF DISCHARGE: No discharge date for patient encounter.  PRIMARY CARE PHYSICIAN: [facility]    ADMISSION DIAGNOSIS:  Ventricular tachycardia (HCC) [I47.2] NSTEMI (non-ST elevated myocardial infarction) ([place]) [I21.4]  DISCHARGE DIAGNOSIS:  Active Problems:   NSTEMI (non-ST elevated myocardial infarction) ([place])   SECONDARY DIAGNOSIS:   Past Medical History:  Diagnosis Date  . Essential hypertension   . Panic attack   . RA (rheumatoid arthritis) [facility])     HOSPITAL COURSE:  1acute non-STEMI Resolving Continue aspirin, statin therapy, beta-blocker therapy, losartan,  received heparin drip for 48 hours, adult pain regiment implemented, weaned off supplemental oxygen, echocardiogram was a normal study, s/p heart catheterization on May 02, 2016 noted for 50% stenosis of mid circumflex - medical management per cardiology, to follow-up with Dr. Gollan/cardiology in 1 week status post discharge for continued evaluation/care  2 acute nonsustained ventricular tachycardia Resolved Echocardiogram was a normal study  Amiodarone discontinued, continued beta-blocker therapy   3chronic rheumatoid arthritis Stable on Enbrel  4chronic GAD,unspecified Stable Continued current regiment  5acute insomnia Stable on Restoril  DISCHARGE CONDITIONS:  On the day of discharge patient is afebrile, heme dynamically stable, tolerated diet, Raquel Sarna without difficulty, free of chest pain/dyspnea, to follow-up with primary care provider/cardiology as directed, for more specific details please see chart  CONSULTS OBTAINED:  Treatment Team:  Minna Merritts, MD  DRUG ALLERGIES:  No Known Allergies  DISCHARGE MEDICATIONS:   Allergies as of 05/03/2017   No  Known Allergies     Medication List    TAKE these medications   ALPRAZolam 0.25 MG tablet Commonly known as:  XANAX Take 0.25 mg by mouth at bedtime as needed for anxiety.   aspirin 81 MG EC tablet Take 1 tablet (81 mg total) by mouth daily. Start taking on:  05/04/2017   atorvastatin 20 MG tablet Commonly known as:  LIPITOR Take 1 tablet (20 mg total) by mouth daily at 6 PM.   bisoprolol 5 MG tablet Commonly known as:  ZEBETA Take 5 mg by mouth daily.   ENBREL SURECLICK 50 MG/ML injection Generic drug:  etanercept Inject 50 mg into the skin once a week. Takes on the weekend   levocetirizine 5 MG tablet Commonly known as:  XYZAL Take 5 mg by mouth daily.   losartan 25 MG tablet Commonly known as:  COZAAR Take 1 tablet (25 mg total) by mouth daily. Start taking on:  05/04/2017   MULTI-VITAMINS Tabs Take 1 tablet by mouth daily.   Vitamin D 2000 units tablet Take 2,000 Units by mouth daily.        DISCHARGE INSTRUCTIONS:    If you experience worsening of your admission symptoms, develop shortness of breath, life threatening emergency, suicidal or homicidal thoughts you must seek medical attention immediately by calling 911 or calling your MD immediately  if symptoms less severe.  You Must read complete instructions/literature along with all the possible adverse reactions/side effects for all the Medicines you take and that have been prescribed to you. Take any new Medicines after you have completely understood and accept all the possible adverse reactions/side effects.   Please note  You were cared for by a hospitalist during your hospital stay. If you have any questions about your discharge medications or the care  you received while you were in the hospital after you are discharged, you can call the unit and asked to speak with the hospitalist on call if the hospitalist that took care of you is not available. Once you are discharged, your primary care physician will  handle any further medical issues. Please note that NO REFILLS for any discharge medications will be authorized once you are discharged, as it is imperative that you return to your primary care physician (or establish a relationship with a primary care physician if you do not have one) for your aftercare needs so that they can reassess your need for medications and monitor your lab values.    Today   CHIEF COMPLAINT:   Chief Complaint  Patient presents with  . Chest Pain    HISTORY OF PRESENT ILLNESS:  42 y.o. female with a known history of rheumatoid arthritis since childhood and panic attacks. The patient was brought to emergency room for acute onset of severe retrosternal chest pain, described as 8 out of 10 pressure, without radiation.  She recalls palpitations during the chest pain episode. The pain started while patient was at rest, watching TV and lasted approximately 20 minutes.  Patient did not notice any alleviating or aggravating factors for the pain.  She denies using any new medications or drugs, no recent unusual stress, or heavy physical activity.  Troponin level was slightly elevated, at 0.25 in the emergency room.  Telemetry monitor showed sustained V. Tach, for which she was started on amiodarone IV.  Patient currently, feels better, asymptomatic.   VITAL SIGNS:  Blood pressure 138/87, pulse 60, temperature 98.5 F (36.9 C), temperature source Oral, resp. rate 18, height 5' 9.5" (1.765 m), weight 78.7 kg (173 lb 6.4 oz), last menstrual period 04/30/2017, SpO2 99 %.  I/O:    Intake/Output Summary (Last 24 hours) at 05/03/2017 0956 Last data filed at 05/03/2017 0308 Gross per 24 hour  Intake 240 ml  Output 1800 ml  Net -1560 ml    PHYSICAL EXAMINATION:  GENERAL:  42 y.o.-year-old patient lying in the bed with no acute distress.  EYES: Pupils equal, round, reactive to light and accommodation. No scleral icterus. Extraocular muscles intact.  HEENT: Head atraumatic,  normocephalic. Oropharynx and nasopharynx clear.  NECK:  Supple, no jugular venous distention. No thyroid enlargement, no tenderness.  LUNGS: Normal breath sounds bilaterally, no wheezing, rales,rhonchi or crepitation. No use of accessory muscles of respiration.  CARDIOVASCULAR: S1, S2 normal. No murmurs, rubs, or gallops.  ABDOMEN: Soft, non-tender, non-distended. Bowel sounds present. No organomegaly or mass.  EXTREMITIES: No pedal edema, cyanosis, or clubbing.  NEUROLOGIC: Cranial nerves II through XII are intact. Muscle strength 5/5 in all extremities. Sensation intact. Gait not checked.  PSYCHIATRIC: The patient is alert and oriented x 3.  SKIN: No obvious rash, lesion, or ulcer.   DATA REVIEW:   CBC Recent Labs  Lab 05/03/17 0436  WBC 6.9  HGB 13.1  HCT 39.6  PLT 270    Chemistries  Recent Labs  Lab 05/01/17 0412 05/01/17 1053  NA 139  --   K 3.6  --   CL 104  --   CO2 27  --   GLUCOSE 108*  --   BUN 17  --   CREATININE 0.64  --   CALCIUM 9.6  --   MG  --  1.8    Cardiac Enzymes Recent Labs  Lab 05/01/17 1627  TROPONINI 2.47*    Microbiology Results  No results  found for this or any previous visit.  RADIOLOGY:  No results found.  EKG:   Orders placed or performed during the hospital encounter of 04/30/17  . EKG 12-Lead  . EKG 12-Lead  . ED EKG within 10 minutes  . ED EKG within 10 minutes      Management plans discussed with the patient, family and they are in agreement.  CODE STATUS:     Code Status Orders  (From admission, onward)        Start     Ordered   05/01/17 0228  Full code  Continuous     05/01/17 0228    Code Status History    Date Active Date Inactive Code Status Order ID Comments User Context   This patient has a current code status but no historical code status.      TOTAL TIME TAKING CARE OF THIS PATIENT: 40 minutes.    Avel Peace Jasmen Emrich M.D on 05/03/2017 at 9:56 AM  Between 7am to 6pm - Pager -  [phone]  After 6pm go to [email or web address] - password [email or web address] [facility]  Office  [phone]  CC: Primary care physician; Katheren Shams   Note: This dictation was prepared with Dragon dictation along with smaller phrase technology. Any transcriptional errors that result from this process are unintentional.

## 2017-05-05 ENCOUNTER — Encounter: Payer: Self-pay | Admitting: Cardiovascular Disease

## 2017-05-06 ENCOUNTER — Ambulatory Visit (INDEPENDENT_AMBULATORY_CARE_PROVIDER_SITE_OTHER): Payer: BLUE CROSS/BLUE SHIELD | Admitting: Certified Nurse Midwife

## 2017-05-06 ENCOUNTER — Encounter: Payer: Self-pay | Admitting: Certified Nurse Midwife

## 2017-05-06 ENCOUNTER — Other Ambulatory Visit: Payer: Self-pay | Admitting: Certified Nurse Midwife

## 2017-05-06 VITALS — BP 126/80 | HR 64 | Ht 69.5 in | Wt 177.0 lb

## 2017-05-06 DIAGNOSIS — Z1239 Encounter for other screening for malignant neoplasm of breast: Secondary | ICD-10-CM

## 2017-05-06 DIAGNOSIS — J309 Allergic rhinitis, unspecified: Secondary | ICD-10-CM | POA: Insufficient documentation

## 2017-05-06 DIAGNOSIS — F410 Panic disorder [episodic paroxysmal anxiety] without agoraphobia: Secondary | ICD-10-CM | POA: Insufficient documentation

## 2017-05-06 DIAGNOSIS — Z124 Encounter for screening for malignant neoplasm of cervix: Secondary | ICD-10-CM

## 2017-05-06 DIAGNOSIS — Z1231 Encounter for screening mammogram for malignant neoplasm of breast: Secondary | ICD-10-CM | POA: Diagnosis not present

## 2017-05-06 DIAGNOSIS — Z01419 Encounter for gynecological examination (general) (routine) without abnormal findings: Secondary | ICD-10-CM

## 2017-05-06 DIAGNOSIS — I10 Essential (primary) hypertension: Secondary | ICD-10-CM | POA: Insufficient documentation

## 2017-05-06 DIAGNOSIS — M069 Rheumatoid arthritis, unspecified: Secondary | ICD-10-CM | POA: Insufficient documentation

## 2017-05-06 LAB — HM PAP SMEAR: HM Pap smear: NORMAL

## 2017-05-06 NOTE — Progress Notes (Signed)
Gynecology Annual Exam  PCP: Katheren Shams  Chief Complaint:  Chief Complaint  Patient presents with  . Gynecologic Exam    History of Present Illness:Nicole Zuniga is a 42 year old Caucasian/White female, G3 P3003, who presents for her annual exam. She complains of continued problems with heavy menstrual cycles. Her menses are regular and her LMP was 04/26/2017. They occur every month , they last 6-7 days , are medium flow (on heavier days changes tampons q2-3hours), and are without clots.  She has had no spotting.  She denies dysmenorrhea.  The patient's past medical history is notable for a history of hypertension, rheumatoid arthritis, basal cell carcinoma, anxiety and allergic rhinitis..  Since her last annual GYN exam dated 05/03/2016 , she has had a mild non ST elevated MI (last week.) and was also treated for ventricular tachycardia   She is sexually active. She is currently using a vasectomy for contraception.  Her most recent pap smear was obtained 05/03/2016 and was NIL/negative HRHPV.  Her most recent mammogram obtained on 06/06/2017 was normal.  There is a positive history of breast cancer in her maternal aunt. Genetic testing has not been done.  There is no family history of ovarian cancer.  The patient does do monthly self breast exams.  The patient does not smoke.  The patient does drink infrequently.  The patient does not use illegal drugs.  The patient exercises 4-5 x/week.  The patient does get adequate calcium in her diet.  She had a recent cholesterol screen in 2019 that was normal.     The patient denies current symptoms of depression.    Review of Systems: Review of Systems  Constitutional: Negative for chills, fever and weight loss.  HENT: Negative for congestion, sinus pain and sore throat.   Eyes: Negative for blurred vision and pain.  Respiratory: Negative for hemoptysis, shortness of breath and wheezing.   Cardiovascular: Negative for  chest pain, palpitations and leg swelling.  Gastrointestinal: Negative for abdominal pain, blood in stool, diarrhea, heartburn, nausea and vomiting.  Genitourinary: Negative for dysuria, frequency, hematuria and urgency.  Musculoskeletal: Negative for back pain, joint pain and myalgias.  Skin: Negative for itching and rash.  Neurological: Negative for dizziness, tingling and headaches.  Endo/Heme/Allergies: Positive for environmental allergies. Negative for polydipsia. Does not bruise/bleed easily.       Negative for hirsutism   Psychiatric/Behavioral: Negative for depression. The patient is nervous/anxious. The patient does not have insomnia.     Past Medical History:  Past Medical History:  Diagnosis Date  . Allergic rhinitis   . Anxiety   . Basal cell carcinoma 2006   left superior lateral chest at breast  . Essential hypertension   . Non-ST elevation (NSTEMI) myocardial infarction ([place]) 04/2017  . Panic attack   . RA (rheumatoid arthritis) ([place])     Past Surgical History:  Past Surgical History:  Procedure Laterality Date  . HERNIA REPAIR  07/2010   LIH  . KNEE SURGERY Left   . LEFT HEART CATH AND CORONARY ANGIOGRAPHY N/A 05/02/2017   Procedure: LEFT HEART CATH AND CORONARY ANGIOGRAPHY;  Surgeon: Wellington Hampshire, MD;  Location: [facility];  Service: Cardiovascular;  Laterality: N/A;  . NECK SURGERY     Excision of lymph node    Family History:  Family History  Problem Relation Age of Onset  . Hypertension Mother   . Hyperlipidemia Mother   . CAD Father  s/p CABG in his late 78's  . Hypertension Father   . Hyperlipidemia Father   . Congestive Heart Failure Father   . Leukemia Paternal Grandfather   . Breast cancer Maternal Aunt 70    Social History:  Social History   Socioeconomic History  . Marital status: Married    Spouse name: Mallie Mussel  . Number of children: 3  . Years of education: Not on file  . Highest education level: Not on file    Social Needs  . Financial resource strain: Not on file  . Food insecurity - worry: Not on file  . Food insecurity - inability: Not on file  . Transportation needs - medical: Not on file  . Transportation needs - non-medical: Not on file  Occupational History  . Occupation: Landscape architect  Tobacco Use  . Smoking status: Never Smoker  . Smokeless tobacco: Never Used  Substance and Sexual Activity  . Alcohol use: No    Frequency: Never  . Drug use: No  . Sexual activity: Yes    Partners: Male    Birth control/protection: Other-see comments    Comment: Vasectomy  Other Topics Concern  . Not on file  Social History Narrative   Lives locally with her husband and three children (ages 62, 35, 77).  Works as Landscape architect.  Exercises regularly.    Allergies:  Allergies  Allergen Reactions  . Lisinopril     Other reaction(s): Cough, Other (See Comments)    Medications: Prior to Admission medications   Medication Sig Start Date End Date Taking? Authorizing Provider  ALPRAZolam Duanne Moron) 0.25 MG tablet Take 0.25 mg by mouth at bedtime as needed for anxiety.  04/23/17   [provider]  aspirin EC 81 MG EC tablet Take 1 tablet (81 mg total) by mouth daily. 05/04/17   Salary, Avel Peace, MD  atorvastatin (LIPITOR) 20 MG tablet Take 1 tablet (20 mg total) by mouth daily at 6 PM. 05/03/17   Salary, Holly Bodily D, MD  bisoprolol (ZEBETA) 5 MG tablet Take 5 mg by mouth daily. 04/03/17   [provider]  Cholecalciferol (VITAMIN D) 2000 units tablet Take 2,000 Units by mouth daily.    [provider]  ENBREL SURECLICK 50 MG/ML injection Inject 50 mg into the skin once a week. Takes on the weekend 04/26/17   [provider]  levocetirizine (XYZAL) 5 MG tablet Take 5 mg by mouth daily. 03/21/16   [provider]  losartan (COZAAR) 25 MG tablet Take 1 tablet (25 mg total) by mouth daily. 05/04/17   Salary, Avel Peace, MD  Multiple Vitamin (MULTI-VITAMINS) TABS  Take 1 tablet by mouth daily. 03/03/07   [provider]    Physical Exam Vitals: BP 126/80   Pulse 64   Ht 5' 9.5" (1.765 m)   Wt 177 lb (80.3 kg)   LMP 04/26/2017 (Exact Date)   BMI 25.76 kg/m .  General: WF in NAD HEENT: normocephalic, anicteric Neck: no thyroid enlargement, no palpable nodules, no cervical lymphadenopathy  Pulmonary: No increased work of breathing, CTAB Cardiovascular: RRR, without murmur  Breast: Breast symmetrical, no tenderness, no palpable nodules or masses, no skin changes. Inverted nipples bilaterally (no change),  no nipple discharge.  No axillary, infraclavicular or supraclavicular lymphadenopathy. Abdomen: Soft, non-tender, non-distended.  Umbilicus without lesions.  No hepatomegaly or masses palpable. No evidence of hernia. Genitourinary:  External: Normal external female genitalia.  Normal urethral meatus, normal Bartholin's and Skene's glands.    Vagina: Normal  vaginal mucosa, no evidence of prolapse.    Cervix: Grossly normal in appearance, no bleeding, non-tender  Uterus: Retroflexted, normal size, shape, and consistency, mobile, and non-tender  Adnexa: No adnexal masses, non-tender  Rectal: deferred  Lymphatic: no evidence of inguinal lymphadenopathy Extremities: no edema, erythema, or tenderness Neurologic: Grossly intact Psychiatric: mood appropriate, affect full     Assessment: 42 y.o. normal gyn exam Subjective menorrhagia  Plan:    1) Breast cancer screening - recommend monthly self breast exam and annual screening mammograms. Mammogram was ordered today.Marland Kitchen  2) Cervical cancer screening - Pap was done. ASCCP guidelines and rational discussed.  Patient opts for yearly screening interval  3) Discussed various treatments for menorrhagia , ie NSAIDS, Depo, progesterone IUD, ablation. Patient to consider options.  4) Routine healthcare maintenance including cholesterol and diabetes screening managed by PCP   5.)RTO 1 year and  prn  Dalia Heading, CNM

## 2017-05-07 NOTE — Progress Notes (Signed)
Cardiology Office Note  Date:  05/08/2017   ID:  Nicole Zuniga, DOB Apr 12, 1976, MRN [id number]  PCP:  Katheren Shams   Chief Complaint  Patient presents with  . Other    Hospital follow up. Patient denies chest pain and SOB. Meds reviewed verbaly with patient.     HPI:  42 y.o. female w/ a h/o  RA,  anxiety, panic attacks,  admitted 1/16 with chest pain and NSTEMI -  trop peaked @ 4.38.  Echo 1/17 showed nl EF w/o wma's. Presents for follow-up after recent hospitalization for non-STEMI, coronary artery disease  Husband presents with her on today's visit, Recent hospitalization reviewed with her and husband in detail Cardiac catheterization performed 05/02/2017  Mid Cx lesion is 50% stenosed.  The left ventricular systolic function is normal.  LV end diastolic pressure is mildly elevated.  The left ventricular ejection fraction is 55-65% by visual estimate.   1.  Moderate one-vessel coronary artery disease with 50% stenosis in the mid left circumflex that has an appearance that is suggestive but not conclusive of spontaneous coronary artery dissection. 2.  Normal LV systolic function and mildly elevated left ventricular end-diastolic pressure.  Since discharge periods of anxiety, bruising right radial access site Has not restarted her exercise has been 1 week,  Takes care of 3 children, husband on the road 3-4 days/week  EKG personally reviewed by myself on todays visit Shows normal sinus rhythm rate 65 bpm T wave abnormality 3, aVF, V4 through V6    PMH:   has a past medical history of Allergic rhinitis, Anxiety, Basal cell carcinoma (2006), Essential hypertension, Non-ST elevation (NSTEMI) myocardial infarction ([place]) (04/2017), Panic attack, and RA (rheumatoid arthritis) ([place]).  PSH:    Past Surgical History:  Procedure Laterality Date  . HERNIA REPAIR  07/2010   LIH  . KNEE SURGERY Left   . LEFT HEART CATH AND CORONARY ANGIOGRAPHY N/A 05/02/2017   Procedure: LEFT HEART CATH AND CORONARY ANGIOGRAPHY;  Surgeon: Wellington Hampshire, MD;  Location: [facility];  Service: Cardiovascular;  Laterality: N/A;  . NECK SURGERY     Excision of lymph node    Current Outpatient Medications  Medication Sig Dispense Refill  . ALPRAZolam (XANAX) 0.25 MG tablet Take 0.25 mg by mouth at bedtime as needed for anxiety.   5  . aspirin EC 81 MG EC tablet Take 1 tablet (81 mg total) by mouth daily. 180 tablet 1  . atorvastatin (LIPITOR) 20 MG tablet Take 1 tablet (20 mg total) by mouth daily at 6 PM. 90 tablet 0  . bisoprolol (ZEBETA) 5 MG tablet Take 5 mg by mouth daily.  0  . Cholecalciferol (VITAMIN D) 2000 units tablet Take 2,000 Units by mouth daily.    Nicole Zuniga SURECLICK 50 MG/ML injection Inject 50 mg into the skin once a week. Takes on the weekend  2  . levocetirizine (XYZAL) 5 MG tablet Take 5 mg by mouth daily.    Marland Kitchen losartan (COZAAR) 25 MG tablet Take 1 tablet (25 mg total) by mouth daily. 90 tablet 0  . Multiple Vitamin (MULTI-VITAMINS) TABS Take 1 tablet by mouth daily.     No current facility-administered medications for this visit.      Allergies:   Lisinopril   Social History:  The patient  reports that  has never smoked. she has never used smokeless tobacco. She reports that she does not drink alcohol or use drugs.   Family History:   family  history includes Breast cancer (age of onset: 1) in her maternal aunt; CAD in her father; Congestive Heart Failure in her father; Hyperlipidemia in her father and mother; Hypertension in her father and mother; Leukemia in her paternal grandfather.    Review of Systems: Review of Systems  Constitutional: Negative.   Respiratory: Negative.   Cardiovascular: Negative.   Gastrointestinal: Negative.   Musculoskeletal: Negative.   Neurological: Negative.   Psychiatric/Behavioral: Negative.   All other systems reviewed and are negative.    PHYSICAL EXAM: VS:  BP 134/78 (BP Location: Left  Arm, Patient Position: Sitting, Cuff Size: Normal)   Pulse 65   Ht 5' 9.5" (1.765 m)   Wt 176 lb (79.8 kg)   LMP 04/30/2017 (Exact Date)   BMI 25.62 kg/m  , BMI Body mass index is 25.62 kg/m. GEN: Well nourished, well developed, in no acute distress  HEENT: normal  Neck: no JVD, carotid bruits, or masses Cardiac: RRR; no murmurs, rubs, or gallops,no edema  Respiratory:  clear to auscultation bilaterally, normal work of breathing GI: soft, nontender, nondistended, + BS MS: no deformity or atrophy  Skin: warm and dry, no rash Neuro:  Strength and sensation are intact Psych: euthymic mood, full affect    Recent Labs: 05/01/2017: BUN 17; Creatinine, Ser 0.64; Magnesium 1.8; Potassium 3.6; Sodium 139 05/03/2017: Hemoglobin 13.1; Platelets 270    Lipid Panel No results found for: CHOL, HDL, LDLCALC, TRIG    Wt Readings from Last 3 Encounters:  05/08/17 176 lb (79.8 kg)  05/06/17 177 lb (80.3 kg)  05/03/17 173 lb 6.4 oz (78.7 kg)       ASSESSMENT AND PLAN:  NSTEMI (non-ST elevated myocardial infarction) ([place]) - Plan: EKG 12-Lead Felt to be secondary to coronary dissection mid left circumflex nonocclusive Long discussion with her concerning findings,  Recovery restrictions,  On aspirin, statin and beta-blocker  Essential hypertension - Plan: EKG 12-Lead Discussed each medication, will continue her current medications for now including ARB and beta-blocker  Rheumatoid arthritis, involving unspecified site, unspecified rheumatoid factor presence ([place]) Managed by primary care and rheumatology   Disposition:   F/U  6 months   Total encounter time more than 45 minutes  Greater than 50% was spent in counseling and coordination of care with the patient    Orders Placed This Encounter  Procedures  . EKG 12-Lead     Signed, Esmond Plants, M.D., Ph.D. 05/08/2017  [employer], [place]

## 2017-05-08 ENCOUNTER — Encounter: Payer: Self-pay | Admitting: *Deleted

## 2017-05-08 ENCOUNTER — Encounter: Payer: Self-pay | Admitting: Cardiovascular Disease

## 2017-05-08 ENCOUNTER — Ambulatory Visit (INDEPENDENT_AMBULATORY_CARE_PROVIDER_SITE_OTHER): Payer: BLUE CROSS/BLUE SHIELD | Admitting: Cardiovascular Disease

## 2017-05-08 VITALS — BP 134/78 | HR 65 | Ht 69.5 in | Wt 176.0 lb

## 2017-05-08 DIAGNOSIS — M069 Rheumatoid arthritis, unspecified: Secondary | ICD-10-CM | POA: Diagnosis not present

## 2017-05-08 DIAGNOSIS — I214 Non-ST elevation (NSTEMI) myocardial infarction: Secondary | ICD-10-CM | POA: Diagnosis not present

## 2017-05-08 DIAGNOSIS — I10 Essential (primary) hypertension: Secondary | ICD-10-CM

## 2017-05-08 NOTE — Patient Instructions (Signed)

## 2017-05-09 LAB — IGP,RFX APTIMA HPV ALL PTH: PAP Smear Comment: 0

## 2017-05-12 ENCOUNTER — Telehealth: Payer: Self-pay | Admitting: Cardiovascular Disease

## 2017-05-12 NOTE — Telephone Encounter (Signed)
Left voicemail message to call back  

## 2017-05-12 NOTE — Telephone Encounter (Signed)
Pt calling stating she forgot to ask last time when she was here   Wanted to know if it was okay to take Alive, for she takes low dose Asprin  This being for she has heavy bleeding and her OB GYN advised her to call us and make sure she can take both.  She states she would only really take it during her menstrual cycle  Please advise

## 2017-05-13 NOTE — Telephone Encounter (Signed)
Pt is returning your call. If you cannot reach her on her cell, try [phone]

## 2017-05-13 NOTE — Telephone Encounter (Signed)
S/w patient and let her know I would forward to Dr Rockey Situ for his advice. She had 2 questions: 1. Is it ok to take Aleve during her cycle while being on aspirin 81 mg daily?  2. She is on Enbrel. After heart cath Dr Fletcher Anon mentioned he would talk to Dr Rockey Situ about whether or not Enbrel was ok for her to be on? She wondered what the out come of conversation was. Her Rheumatologist is Licensed conveyancer in [place].  Routing to Dr Rockey Situ.

## 2017-05-17 NOTE — Telephone Encounter (Signed)
She should talk to her rheumatologist about Enbrel I think she can continue this medication Okay to take NSAIDs sparingly

## 2017-05-19 NOTE — Telephone Encounter (Signed)
Left voicemail message to call back  

## 2017-05-19 NOTE — Telephone Encounter (Signed)
Pt returning our call  She states if we can't reach her at her cell phone we may try her work number.   Work: [phone]

## 2017-05-19 NOTE — Telephone Encounter (Signed)
Spoke with patient and reviewed Dr. Donivan Scull recommendations. She reported upcoming appointment with her rheumatologist and will review with them as well. She reports that she only takes the NSAID'S on occasion and not frequently. She was appreciative for the call with no further questions or concerns at this time.

## 2017-05-23 ENCOUNTER — Telehealth: Payer: Self-pay | Admitting: Cardiovascular Disease

## 2017-05-23 NOTE — Telephone Encounter (Signed)
Pt calling stating she's on atorvastatin  On it for about 3 weeks She feels over all achy  She started exercising(this past Saturday)  and thought it was that  But she states she is not sure if it is that or not this has been going on for a while now  Please call back

## 2017-05-23 NOTE — Telephone Encounter (Signed)
I called and left a message on the patient's voice mail (ok per DPR). I advised her that her symptoms may be from atorvastatin and that she may stop this over the weekend and be off of it for about 5-7 days. I have asked her to call back Monday to confirm she got this message.

## 2017-05-26 NOTE — Telephone Encounter (Signed)
I left a message for the patient to call. 

## 2017-05-26 NOTE — Telephone Encounter (Signed)
I spoke with the patient- she states she got the message to stay off of the atorvastatin over the weekend and she feels less sluggish. I advised her to remain off this the rest of the week and call us on Monday 2/18 with how she is doing. She voices understanding and is agreeable.

## 2017-06-02 NOTE — Telephone Encounter (Signed)
I left a message for the patient to call. 

## 2017-06-02 NOTE — Telephone Encounter (Signed)
Patient returning call to talk about medication she stopped taking please call

## 2017-06-03 NOTE — Telephone Encounter (Signed)
If she would like she could try generic Crestor lowest dose 5 mg daily

## 2017-06-03 NOTE — Telephone Encounter (Signed)
I spoke with the patient. She states she is feeling much better off of the atrovastatin. Her fatigue/ muscle/ joint discomfort has improved back to her baseline. She states she has not taken any other statins in the past.  I advised her I would forward to Dr. Rockey Situ to review and we will call her back with any further recommendations. She is agreeable.   The patient did also report a very brief fluttering in her heart that she felt yesterday- this lasted only seconds and is not new for her. I advised her if the episodes become more frequent/ are lasting longer to call us back and let us know. She voices understanding.

## 2017-06-04 MED ORDER — ROSUVASTATIN CALCIUM 5 MG PO TABS: 5.0000 mg | ORAL_TABLET | Freq: Every day | ORAL | 6 refills | Status: DC

## 2017-06-04 NOTE — Telephone Encounter (Signed)
Pt is returning your call, after the next hour [phone]

## 2017-06-04 NOTE — Telephone Encounter (Signed)
Spoke with patient and reviewed Dr. Donivan Scull recommendations to try crestor 5 mg once daily to see if that would work better for her. She wanted to know if this was a temporary or permanent medication that she would need. Reviewed that it would be a permanent medication if she tolerates to help with reducing her risk. She verbalized understanding of our conversation, agreement with plan, and had no further questions at this time. Medication sent in to her pharmacy of choice. Instructions for medication reviewed and she verbally confirmed.

## 2017-06-04 NOTE — Telephone Encounter (Signed)
Left voicemail message to call back  

## 2017-06-10 ENCOUNTER — Ambulatory Visit
Admission: RE | Admit: 2017-06-10 | Discharge: 2017-06-10 | Disposition: A | Discharge status: Home / Self Care | Payer: BLUE CROSS/BLUE SHIELD | Source: Ambulatory Visit | Attending: Certified Nurse Midwife | Admitting: Certified Nurse Midwife

## 2017-06-10 DIAGNOSIS — Z1231 Encounter for screening mammogram for malignant neoplasm of breast: Secondary | ICD-10-CM | POA: Diagnosis present

## 2017-09-15 ENCOUNTER — Other Ambulatory Visit: Payer: Self-pay

## 2017-09-15 MED ORDER — LOSARTAN POTASSIUM 25 MG PO TABS: 25.0000 mg | ORAL_TABLET | Freq: Every day | ORAL | 0 refills | Status: DC

## 2017-12-25 ENCOUNTER — Other Ambulatory Visit: Payer: Self-pay | Admitting: Cardiovascular Disease

## 2018-01-09 ENCOUNTER — Telehealth: Payer: Self-pay | Admitting: Cardiovascular Disease

## 2018-01-09 NOTE — Telephone Encounter (Signed)
Patient calling needing to know the last Cholesterol medication  She is trying to re order it but can't seem to remember the name of it  Please call back

## 2018-01-09 NOTE — Telephone Encounter (Signed)
I left a message on the patient's identified voice mail that (ok per DPR) that crestor 5 mg tablets once daily were filled for her last.  To please call the office back if she needed anything else.

## 2018-02-15 NOTE — Progress Notes (Signed)
Cardiology Office Note  Date:  02/17/2018   ID:  Nicole Zuniga, DOB 1975-07-05, MRN [id number]  PCP:  Katheren Shams   Chief Complaint  Patient presents with  . other    6 mo follow up. Medications reviewed verbally.     HPI:  42 y.o. female w/ a h/o  RA,  anxiety, panic attacks,  admitted 1/16 with chest pain and NSTEMI -  trop peaked @ 4.38.  suggestive but not conclusive of spontaneous coronary artery dissection. Echo 1/17 showed nl EF w/o wma's. Presents for follow-up after recent hospitalization for non-STEMI, coronary artery disease  In follow-up today she reports that she feels well, exercises at home on a regular basis doing her own regiment Denies any significant chest pain or shortness of breath on exertion Recent stressors, husband recently lost his job yesterday  Discussed prior Cardiac catheterization performed 05/02/2017 Images pulled up in the office,   Mid Cx lesion is 50% stenosed.  The left ventricular systolic function is normal.  LV end diastolic pressure is mildly elevated.  The left ventricular ejection fraction is 55-65% by visual estimate.   1.  Moderate one-vessel coronary artery disease with 50% stenosis in the mid left circumflex that has an appearance that is suggestive but not conclusive of spontaneous coronary artery dissection. 2.  Normal LV systolic function and mildly elevated left ventricular end-diastolic pressure.  EKG personally reviewed by myself on todays visit Shows normal sinus rhythm rate 66 bpm no significant ST or T wave changes   PMH:   has a past medical history of Allergic rhinitis, Anxiety, Basal cell carcinoma (2006), Essential hypertension, Non-ST elevation (NSTEMI) myocardial infarction (Nicole Zuniga) (04/2017), Panic attack, and RA (rheumatoid arthritis) ([place]).  PSH:    Past Surgical History:  Procedure Laterality Date  . HERNIA REPAIR  07/2010   LIH  . KNEE SURGERY Left   . LEFT HEART CATH AND CORONARY  ANGIOGRAPHY N/A 05/02/2017   Procedure: LEFT HEART CATH AND CORONARY ANGIOGRAPHY;  Surgeon: Wellington Hampshire, MD;  Location: [facility];  Service: Cardiovascular;  Laterality: N/A;  . NECK SURGERY     Excision of lymph node    Current Outpatient Medications  Medication Sig Dispense Refill  . ALPRAZolam (XANAX) 0.25 MG tablet Take 0.25 mg by mouth at bedtime as needed for anxiety.   5  . aspirin EC 81 MG EC tablet Take 1 tablet (81 mg total) by mouth daily. 180 tablet 1  . bisoprolol (ZEBETA) 5 MG tablet Take 5 mg by mouth daily.  0  . Cholecalciferol (VITAMIN D) 2000 units tablet Take 2,000 Units by mouth daily.    Scarlette Shorts SURECLICK 50 MG/ML injection Inject 50 mg into the skin once a week. Takes on the weekend  2  . levocetirizine (XYZAL) 5 MG tablet Take 5 mg by mouth daily.    Marland Kitchen losartan (COZAAR) 25 MG tablet TAKE 1 TABLET(25 MG) BY MOUTH DAILY 90 tablet 0  . Multiple Vitamin (MULTI-VITAMINS) TABS Take 1 tablet by mouth daily.    . rosuvastatin (CRESTOR) 5 MG tablet Take 1 tablet (5 mg total) by mouth daily. 30 tablet 6   No current facility-administered medications for this visit.     Allergies:   Lisinopril   Social History:  The patient  reports that she has never smoked. She has never used smokeless tobacco. She reports that she does not drink alcohol or use drugs.   Family History:   family history includes CAD in  her father; Congestive Heart Failure in her father; Hyperlipidemia in her father and mother; Hypertension in her father and mother; Leukemia in her paternal grandfather.    Review of Systems: Review of Systems  Constitutional: Negative.   Respiratory: Negative.   Cardiovascular: Negative.   Gastrointestinal: Negative.   Musculoskeletal: Negative.   Neurological: Negative.   Psychiatric/Behavioral: Negative.   All other systems reviewed and are negative.   PHYSICAL EXAM: VS:  BP 130/88 (BP Location: Left Arm, Patient Position: Sitting, Cuff Size:  Normal)   Pulse 66   Ht 5' 9.5" (1.765 m)   Wt 185 lb (83.9 kg)   BMI 26.93 kg/m  , BMI Body mass index is 26.93 kg/m. Constitutional:  oriented to person, place, and time. No distress.  HENT:  Head: Normocephalic and atraumatic.  Eyes:  no discharge. No scleral icterus.  Neck: Normal range of motion. Neck supple. No JVD present.  Cardiovascular: Normal rate, regular rhythm, normal heart sounds and intact distal pulses. Exam reveals no gallop and no friction rub. No edema No murmur heard. Pulmonary/Chest: Effort normal and breath sounds normal. No stridor. No respiratory distress.  no wheezes.  no rales.  no tenderness.  Abdominal: Soft.  no distension.  no tenderness.  Musculoskeletal: Normal range of motion.  no  tenderness or deformity.  Neurological:  normal muscle tone. Coordination normal. No atrophy Skin: Skin is warm and dry. No rash noted. not diaphoretic.  Psychiatric:  normal mood and affect. behavior is normal. Thought content normal.    Recent Labs: 05/01/2017: BUN 17; Creatinine, Ser 0.64; Magnesium 1.8; Potassium 3.6; Sodium 139 05/03/2017: Hemoglobin 13.1; Platelets 270    Lipid Panel No results found for: CHOL, HDL, LDLCALC, TRIG    Wt Readings from Last 3 Encounters:  02/17/18 185 lb (83.9 kg)  05/08/17 176 lb (79.8 kg)  05/06/17 177 lb (80.3 kg)      ASSESSMENT AND PLAN:  NSTEMI (non-ST elevated myocardial infarction) ([place]) - Plan: EKG 12-Lead Felt to be secondary to coronary dissection mid left circumflex nonocclusive Images pulled up in the office and discussed again Recommend she continue current medications including aspirin, Crestor, beta-blocker, losartan No further testing needed  Essential hypertension - Plan: EKG 12-Lead Blood pressure is well controlled on today's visit. No changes made to the medications.  Rheumatoid arthritis, involving unspecified site, unspecified rheumatoid factor presence ([place]) Managed by primary care and  rheumatology   Disposition:   F/U  12 months   Total encounter time more than 15 minutes  Greater than 50% was spent in counseling and coordination of care with the patient    Orders Placed This Encounter  Procedures  . EKG 12-Lead     Signed, Esmond Plants, M.D., Ph.D. 02/17/2018  [place], [place]

## 2018-02-17 ENCOUNTER — Ambulatory Visit (INDEPENDENT_AMBULATORY_CARE_PROVIDER_SITE_OTHER): Payer: BLUE CROSS/BLUE SHIELD | Admitting: Cardiovascular Disease

## 2018-02-17 ENCOUNTER — Encounter: Payer: Self-pay | Admitting: Cardiovascular Disease

## 2018-02-17 VITALS — BP 130/88 | HR 66 | Ht 69.5 in | Wt 185.0 lb

## 2018-02-17 DIAGNOSIS — I214 Non-ST elevation (NSTEMI) myocardial infarction: Secondary | ICD-10-CM | POA: Diagnosis not present

## 2018-02-17 DIAGNOSIS — M069 Rheumatoid arthritis, unspecified: Secondary | ICD-10-CM

## 2018-02-17 DIAGNOSIS — I10 Essential (primary) hypertension: Secondary | ICD-10-CM | POA: Diagnosis not present

## 2018-02-17 DIAGNOSIS — I25118 Atherosclerotic heart disease of native coronary artery with other forms of angina pectoris: Secondary | ICD-10-CM

## 2018-02-17 MED ORDER — ROSUVASTATIN CALCIUM 5 MG PO TABS: 5.0000 mg | ORAL_TABLET | Freq: Every day | ORAL | 6 refills | Status: DC

## 2018-02-17 NOTE — Patient Instructions (Signed)

## 2018-03-25 ENCOUNTER — Other Ambulatory Visit: Payer: Self-pay | Admitting: Cardiovascular Disease

## 2018-04-21 ENCOUNTER — Telehealth: Payer: Self-pay

## 2018-04-21 NOTE — Telephone Encounter (Signed)
Left message to call me. Dalia Heading, CNM

## 2018-04-21 NOTE — Telephone Encounter (Signed)
Spoke with [place] about her bleeding. Had spotting /light bleeding the week before a more normal period flow and continues to have light bleeding now. No abdominal pain. Husband had vasectomy. May be an anovulatory cycle. Has appointment to see me next month. If AUB persists will evaluate with ultrasound and possible endometrial biopsy. Dalia Heading, CNM

## 2018-04-21 NOTE — Telephone Encounter (Signed)
Pt is 42yo and experiencing a change in her period this cycle.  She bled the before her period as well as after - bled for 3wks.  The only change is she is now taking MK7 vitamins.  [phone]

## 2018-05-20 ENCOUNTER — Other Ambulatory Visit: Payer: Self-pay

## 2018-05-20 MED ORDER — ASPIRIN 81 MG PO TBEC: 81.0000 mg | DELAYED_RELEASE_TABLET | Freq: Every day | ORAL | 1 refills | Status: DC

## 2018-05-20 NOTE — Telephone Encounter (Signed)
*  STAT* If patient is at the pharmacy, call can be transferred to refill team.   1. Which medications need to be refilled? (please list name of each medication and dose if known) aspirin  2. Which pharmacy/location (including street and city if local pharmacy) is medication to be sent to? [place]  3. Do they need a 30 day or 90 day supply? [place]

## 2018-06-03 NOTE — Progress Notes (Signed)
Gynecology Annual Exam  PCP: Katheren Shams  Chief Complaint:  Chief Complaint  Patient presents with  . Annual Exam    History of Present Illness:Nicole Zuniga is a 43 year old Caucasian/White female, G3 P3003, who presents for her annual exam.  Her menses are usually regular and her LMP was 05/15/2018. They occur every month,  they last 6-7 days, are medium flow (on heavier days changes tampons q2-3hours), and are without clots. She did have a menses that lasted 2 1/2 to 3 weeks starting the last week of December 2019. She denies dysmenorrhea.  The patient's past medical history is notable for a history of hypertension, rheumatoid arthritis, basal cell carcinoma, anxiety and allergic rhinitis.. In January 2019, she had a mild non ST elevated MI  and was also treated for ventricular tachycardia   Since her last annual GYN exam dated 05/06/2017 , she has gained 15#. She also reports having occasional SUI and UI. Denies dysuria, hematuria or foul smelling urine.  She is sexually active. She is currently using a vasectomy for contraception.  Her most recent pap smear was obtained 05/06/2017 and was NIL. Her most recent mammogram obtained on 06/10/2017 was normal.  There is a positive history of breast cancer in her maternal aunt. Genetic testing has not been done.  There is no family history of ovarian cancer.  The patient does do monthly self breast exams.  The patient does not smoke.  The patient does drink infrequently.  The patient does not use illegal drugs.  The patient exercises 4-5 x/week doing [employer] workouts The patient does get adequate calcium in her diet.  She had a recent cholesterol screen in 03/2018 that was normal.   The patient denies current symptoms of depression.    Review of Systems: Review of Systems  Constitutional: Negative for chills, fever and weight loss.  HENT: Negative for congestion, sinus pain and sore throat.   Eyes: Negative for  blurred vision and pain.  Respiratory: Negative for hemoptysis, shortness of breath and wheezing.   Cardiovascular: Negative for chest pain, palpitations and leg swelling.  Gastrointestinal: Negative for abdominal pain, blood in stool, diarrhea, heartburn, nausea and vomiting.  Genitourinary: Negative for dysuria, frequency, hematuria and urgency.       Some SUI and UI  Musculoskeletal: Positive for joint pain. Negative for back pain and myalgias.  Skin: Negative for itching and rash.  Neurological: Negative for dizziness, tingling and headaches.  Endo/Heme/Allergies: Positive for environmental allergies. Negative for polydipsia. Does not bruise/bleed easily.       Negative for hirsutism   Psychiatric/Behavioral: Negative for depression. The patient is nervous/anxious. The patient does not have insomnia.     Past Medical History:  Past Medical History:  Diagnosis Date  . Allergic rhinitis   . Anxiety   . Basal cell carcinoma 2006   left superior lateral chest at breast  . Essential hypertension   . Non-ST elevation (NSTEMI) myocardial infarction ([place]) 04/2017  . Panic attack   . RA (rheumatoid arthritis) ([place])     Past Surgical History:  Past Surgical History:  Procedure Laterality Date  . HERNIA REPAIR  07/2010   LIH  . KNEE SURGERY Left   . LEFT HEART CATH AND CORONARY ANGIOGRAPHY N/A 05/02/2017   Procedure: LEFT HEART CATH AND CORONARY ANGIOGRAPHY;  Surgeon: Wellington Hampshire, MD;  Location: [facility];  Service: Cardiovascular;  Laterality: N/A;  . NECK SURGERY     Excision  of lymph node    Family History:  Family History  Problem Relation Age of Onset  . Hypertension Mother   . Hyperlipidemia Mother   . CAD Father        s/p CABG in his late 35's  . Hypertension Father   . Hyperlipidemia Father   . Congestive Heart Failure Father   . Leukemia Paternal Grandfather    OB History  Gravida Para Term Preterm AB Living  3 3 3     3   SAB TAB Ectopic  Multiple Live Births          3    # Outcome Date GA Lbr Len/2nd Weight Sex Delivery Anes PTL Lv  3 Term 11/11/09 [redacted]w[redacted]d  7 lb (3.175 kg) M Vag-Spont   LIV     Complications: Low amniotic fluid, Chronic hypertension affecting pregnancy  2 Term 04/21/06 [redacted]w[redacted]d  8 lb 5 oz (3.771 kg) M Vag-Spont   LIV  1 Term 06/18/04 [redacted]w[redacted]d  8 lb (3.629 kg) F Vag-Spont   LIV   Social History:  Social History   Socioeconomic History  . Marital status: Married    Spouse name: Mallie Mussel  . Number of children: 3  . Years of education: Not on file  . Highest education level: Not on file  Occupational History  . Occupation: Landscape architect  Social Needs  . Financial resource strain: Not on file  . Food insecurity:    Worry: Not on file    Inability: Not on file  . Transportation needs:    Medical: Not on file    Non-medical: Not on file  Tobacco Use  . Smoking status: Never Smoker  . Smokeless tobacco: Never Used  Substance and Sexual Activity  . Alcohol use: No    Frequency: Never  . Drug use: No  . Sexual activity: Yes    Partners: Male    Birth control/protection: Other-see comments    Comment: Vasectomy  Lifestyle  . Physical activity:    Days per week: 4 days    Minutes per session: 30 min  . Stress: To some extent  Relationships  . Social connections:    Talks on phone: More than three times a week    Gets together: Once a week    Attends religious service: More than 4 times per year    Active member of club or organization: Yes    Attends meetings of clubs or organizations: More than 4 times per year    Relationship status: Married  . Intimate partner violence:    Fear of current or ex partner: No    Emotionally abused: No    Physically abused: No    Forced sexual activity: No  Other Topics Concern  . Not on file  Social History Narrative   Lives locally with her husband and three children (ages 75, 72, 46).  Works as Landscape architect.  Exercises regularly.    Allergies:    Allergies  Allergen Reactions  . Lisinopril     Other reaction(s): Cough, Other (See Comments)    Medications:  Current Outpatient Medications on File Prior to Visit  Medication Sig Dispense Refill  . ALPRAZolam (XANAX) 0.25 MG tablet Take 0.25 mg by mouth at bedtime as needed for anxiety.   5  . aspirin 81 MG EC tablet Take 1 tablet (81 mg total) by mouth daily. 180 tablet 1  . bisoprolol (ZEBETA) 5 MG tablet Take 5 mg by mouth daily.  0  .  Cholecalciferol (VITAMIN D) 2000 units tablet Take 2,000 Units by mouth daily.    Scarlette Shorts SURECLICK 50 MG/ML injection Inject 50 mg into the skin once a week. Takes on the weekend  2  . levocetirizine (XYZAL) 5 MG tablet Take 5 mg by mouth daily.    Marland Kitchen losartan (COZAAR) 25 MG tablet TAKE 1 TABLET BY MOUTH DAILY 90 tablet 3  . Multiple Vitamin (MULTI-VITAMINS) TABS Take 1 tablet by mouth daily.    . rosuvastatin (CRESTOR) 5 MG tablet Take 1 tablet (5 mg total) by mouth daily. 30 tablet 6   No current facility-administered medications on file prior to visit.    Physical Exam Vitals: BP 124/74   Ht 5\' 9"  (1.753 m)   Wt 192 lb (87.1 kg)   LMP 05/15/2018   BMI 28.35 kg/m .  General: WF in NAD HEENT: normocephalic, anicteric Neck: no thyroid enlargement, no palpable nodules, no cervical lymphadenopathy  Pulmonary: No increased work of breathing, CTAB Cardiovascular: RRR, without murmur  Breast: Breast symmetrical, no tenderness, no palpable nodules or masses, no skin changes. Inverted nipples bilaterally (no change),  no nipple discharge.  No axillary, infraclavicular or supraclavicular lymphadenopathy. Abdomen: Soft, non-tender, non-distended.  Umbilicus without lesions.  No hepatomegaly or masses palpable. No evidence of hernia. Genitourinary:  External: Normal external female genitalia.  Normal urethral meatus, normal Bartholin's and Skene's glands.    Vagina: Normal vaginal mucosa, mild rectocele   Cervix: Grossly normal in appearance, no  bleeding, non-tender  Uterus: Retroflexed, normal size, shape, and consistency, mobile, and non-tender  Adnexa: No adnexal masses, non-tender  Rectal: deferred  Lymphatic: no evidence of inguinal lymphadenopathy Extremities: no edema, erythema, or tenderness Neurologic: Grossly intact Psychiatric: mood appropriate, affect full     Assessment: 43 y.o. normal gyn exam MIxed urinary incontinence  Plan:    1) Breast cancer screening - recommend monthly self breast exam and annual screening mammograms. Mammogram was ordered today.Marland Kitchen  2) Cervical cancer screening - Pap was done. ASCCP guidelines and rational discussed.  Patient opts for yearly screening interval  3) Discussed etiology of SUI and UI. Drinks one cup of caffienated coffee a day and dies not use artifical sweeteners. Explained the role of Kegel exercises in treatment of SUI. Discussed how to do a Kegel. Recommend 20 x/day.  4) Routine healthcare maintenance including cholesterol and diabetes screening managed by PCP   5.)RTO 1 year and prn  Dalia Heading, CNM

## 2018-06-04 ENCOUNTER — Encounter: Payer: Self-pay | Admitting: Certified Nurse Midwife

## 2018-06-04 ENCOUNTER — Ambulatory Visit (INDEPENDENT_AMBULATORY_CARE_PROVIDER_SITE_OTHER): Payer: BLUE CROSS/BLUE SHIELD | Admitting: Certified Nurse Midwife

## 2018-06-04 ENCOUNTER — Other Ambulatory Visit (HOSPITAL_COMMUNITY)
Admission: RE | Admit: 2018-06-04 | Discharge: 2018-06-04 | Disposition: A | Discharge status: Home / Self Care | Payer: BLUE CROSS/BLUE SHIELD | Source: Ambulatory Visit | Attending: Certified Nurse Midwife | Admitting: Certified Nurse Midwife

## 2018-06-04 VITALS — BP 124/74 | Ht 69.0 in | Wt 192.0 lb

## 2018-06-04 DIAGNOSIS — Z124 Encounter for screening for malignant neoplasm of cervix: Secondary | ICD-10-CM | POA: Insufficient documentation

## 2018-06-04 DIAGNOSIS — Z01419 Encounter for gynecological examination (general) (routine) without abnormal findings: Secondary | ICD-10-CM | POA: Insufficient documentation

## 2018-06-04 DIAGNOSIS — Z1239 Encounter for other screening for malignant neoplasm of breast: Secondary | ICD-10-CM

## 2018-06-04 DIAGNOSIS — N3946 Mixed incontinence: Secondary | ICD-10-CM | POA: Insufficient documentation

## 2018-06-04 NOTE — Patient Instructions (Signed)
Kegel Exercises  Kegel exercises help strengthen the muscles that support the rectum, vagina, small intestine, bladder, and uterus. Doing Kegel exercises can help:   Improve bladder and bowel control.   Improve sexual response.   Reduce problems and discomfort during pregnancy.  Kegel exercises involve squeezing your pelvic floor muscles, which are the same muscles you squeeze when you try to stop the flow of urine. The exercises can be done while sitting, standing, or lying down, but it is best to vary your position.  Exercises  1. Squeeze your pelvic floor muscles tight. You should feel a tight lift in your rectal area. If you are a female, you should also feel a tightness in your vaginal area. Keep your stomach, buttocks, and legs relaxed.  2. Hold the muscles tight for up to 10 seconds.  3. Relax your muscles.  Repeat this exercise 50 times a day or as many times as told by your health care provider. Continue to do this exercise for at least 4-6 weeks or for as long as told by your health care provider.  This information is not intended to replace advice given to you by your health care provider. Make sure you discuss any questions you have with your health care provider.  Document Released: 03/18/2012 Document Revised: 08/12/2016 Document Reviewed: 02/19/2015  Elsevier Interactive Patient Education  2019 [employer]

## 2018-06-08 LAB — CYTOLOGY - PAP: Diagnosis: NEGATIVE

## 2018-06-30 ENCOUNTER — Ambulatory Visit
Admission: RE | Admit: 2018-06-30 | Discharge: 2018-06-30 | Disposition: A | Discharge status: Home / Self Care | Payer: BLUE CROSS/BLUE SHIELD | Source: Ambulatory Visit | Attending: Certified Nurse Midwife | Admitting: Certified Nurse Midwife

## 2018-06-30 ENCOUNTER — Other Ambulatory Visit: Payer: Self-pay

## 2018-06-30 DIAGNOSIS — Z1239 Encounter for other screening for malignant neoplasm of breast: Secondary | ICD-10-CM | POA: Diagnosis present

## 2018-06-30 DIAGNOSIS — Z1231 Encounter for screening mammogram for malignant neoplasm of breast: Secondary | ICD-10-CM | POA: Insufficient documentation

## 2018-07-02 ENCOUNTER — Other Ambulatory Visit: Payer: Self-pay | Admitting: Certified Nurse Midwife

## 2018-07-02 DIAGNOSIS — R928 Other abnormal and inconclusive findings on diagnostic imaging of breast: Secondary | ICD-10-CM

## 2018-07-02 DIAGNOSIS — N6489 Other specified disorders of breast: Secondary | ICD-10-CM

## 2018-07-09 ENCOUNTER — Other Ambulatory Visit: Payer: Self-pay

## 2018-07-09 ENCOUNTER — Ambulatory Visit
Admission: RE | Admit: 2018-07-09 | Discharge: 2018-07-09 | Disposition: A | Discharge status: Home / Self Care | Payer: BLUE CROSS/BLUE SHIELD | Source: Ambulatory Visit | Attending: Certified Nurse Midwife | Admitting: Certified Nurse Midwife

## 2018-07-09 DIAGNOSIS — N6489 Other specified disorders of breast: Secondary | ICD-10-CM | POA: Diagnosis present

## 2018-07-09 DIAGNOSIS — R928 Other abnormal and inconclusive findings on diagnostic imaging of breast: Secondary | ICD-10-CM

## 2018-10-07 ENCOUNTER — Other Ambulatory Visit: Payer: Self-pay | Admitting: Cardiovascular Disease

## 2018-11-04 ENCOUNTER — Telehealth: Payer: Self-pay

## 2018-11-04 NOTE — Telephone Encounter (Signed)
Pt calling c/o going thru premenopause; is bleeding heavily; no cramps; has changed tampon 6-7 times already today.  What can she take to slow bleeding down - aleve or something?  [phone]

## 2018-11-04 NOTE — Telephone Encounter (Signed)
Has not had a period since 05/15/2018 until 2 days ago. Is having heavier bleeding today. Has already changed tampon 6 or 7 times. No cramping. Has had hot flashes...was taking Black Cohash for hot flashes. Can try Aleve twice a day to see if bleeding is decreased. Discussed letting me know if bleeding heavily, if menses get closer together than 21 days, or last longer than 8-10 days.

## 2019-01-20 DIAGNOSIS — M9902 Segmental and somatic dysfunction of thoracic region: Secondary | ICD-10-CM | POA: Diagnosis not present

## 2019-01-20 DIAGNOSIS — M9903 Segmental and somatic dysfunction of lumbar region: Secondary | ICD-10-CM | POA: Diagnosis not present

## 2019-01-20 DIAGNOSIS — M62830 Muscle spasm of back: Secondary | ICD-10-CM | POA: Diagnosis not present

## 2019-01-20 DIAGNOSIS — M5134 Other intervertebral disc degeneration, thoracic region: Secondary | ICD-10-CM | POA: Diagnosis not present

## 2019-02-10 DIAGNOSIS — M9903 Segmental and somatic dysfunction of lumbar region: Secondary | ICD-10-CM | POA: Diagnosis not present

## 2019-02-10 DIAGNOSIS — M62830 Muscle spasm of back: Secondary | ICD-10-CM | POA: Diagnosis not present

## 2019-02-10 DIAGNOSIS — M9902 Segmental and somatic dysfunction of thoracic region: Secondary | ICD-10-CM | POA: Diagnosis not present

## 2019-02-10 DIAGNOSIS — M5134 Other intervertebral disc degeneration, thoracic region: Secondary | ICD-10-CM | POA: Diagnosis not present

## 2019-03-07 DIAGNOSIS — Z20828 Contact with and (suspected) exposure to other viral communicable diseases: Secondary | ICD-10-CM | POA: Diagnosis not present

## 2019-03-10 DIAGNOSIS — M9902 Segmental and somatic dysfunction of thoracic region: Secondary | ICD-10-CM | POA: Diagnosis not present

## 2019-03-10 DIAGNOSIS — M9903 Segmental and somatic dysfunction of lumbar region: Secondary | ICD-10-CM | POA: Diagnosis not present

## 2019-03-10 DIAGNOSIS — M5134 Other intervertebral disc degeneration, thoracic region: Secondary | ICD-10-CM | POA: Diagnosis not present

## 2019-03-10 DIAGNOSIS — M62830 Muscle spasm of back: Secondary | ICD-10-CM | POA: Diagnosis not present

## 2019-03-17 ENCOUNTER — Other Ambulatory Visit: Payer: Self-pay | Admitting: *Deleted

## 2019-03-17 MED ORDER — ROSUVASTATIN CALCIUM 5 MG PO TABS: ORAL_TABLET | ORAL | 0 refills | Status: DC

## 2019-03-24 DIAGNOSIS — M9902 Segmental and somatic dysfunction of thoracic region: Secondary | ICD-10-CM | POA: Diagnosis not present

## 2019-03-24 DIAGNOSIS — M5134 Other intervertebral disc degeneration, thoracic region: Secondary | ICD-10-CM | POA: Diagnosis not present

## 2019-03-24 DIAGNOSIS — M62830 Muscle spasm of back: Secondary | ICD-10-CM | POA: Diagnosis not present

## 2019-03-24 DIAGNOSIS — M9903 Segmental and somatic dysfunction of lumbar region: Secondary | ICD-10-CM | POA: Diagnosis not present

## 2019-04-01 DIAGNOSIS — Z85828 Personal history of other malignant neoplasm of skin: Secondary | ICD-10-CM | POA: Diagnosis not present

## 2019-04-01 DIAGNOSIS — Z872 Personal history of diseases of the skin and subcutaneous tissue: Secondary | ICD-10-CM | POA: Diagnosis not present

## 2019-04-01 DIAGNOSIS — Z1389 Encounter for screening for other disorder: Secondary | ICD-10-CM | POA: Diagnosis not present

## 2019-04-01 DIAGNOSIS — C44712 Basal cell carcinoma of skin of right lower limb, including hip: Secondary | ICD-10-CM | POA: Diagnosis not present

## 2019-04-01 DIAGNOSIS — Z0000 Encounter for general adult medical examination without abnormal findings: Secondary | ICD-10-CM | POA: Diagnosis not present

## 2019-04-01 DIAGNOSIS — X32XXXA Exposure to sunlight, initial encounter: Secondary | ICD-10-CM | POA: Diagnosis not present

## 2019-04-01 DIAGNOSIS — D485 Neoplasm of uncertain behavior of skin: Secondary | ICD-10-CM | POA: Diagnosis not present

## 2019-04-01 DIAGNOSIS — Z08 Encounter for follow-up examination after completed treatment for malignant neoplasm: Secondary | ICD-10-CM | POA: Diagnosis not present

## 2019-04-01 DIAGNOSIS — Z131 Encounter for screening for diabetes mellitus: Secondary | ICD-10-CM | POA: Diagnosis not present

## 2019-04-07 DIAGNOSIS — M9903 Segmental and somatic dysfunction of lumbar region: Secondary | ICD-10-CM | POA: Diagnosis not present

## 2019-04-07 DIAGNOSIS — M9902 Segmental and somatic dysfunction of thoracic region: Secondary | ICD-10-CM | POA: Diagnosis not present

## 2019-04-07 DIAGNOSIS — M5134 Other intervertebral disc degeneration, thoracic region: Secondary | ICD-10-CM | POA: Diagnosis not present

## 2019-04-07 DIAGNOSIS — M62830 Muscle spasm of back: Secondary | ICD-10-CM | POA: Diagnosis not present

## 2019-04-26 ENCOUNTER — Other Ambulatory Visit: Payer: Self-pay | Admitting: Cardiovascular Disease

## 2019-05-03 DIAGNOSIS — E559 Vitamin D deficiency, unspecified: Secondary | ICD-10-CM | POA: Diagnosis not present

## 2019-05-03 DIAGNOSIS — M0880 Other juvenile arthritis, unspecified site: Secondary | ICD-10-CM | POA: Diagnosis not present

## 2019-05-03 DIAGNOSIS — Z0000 Encounter for general adult medical examination without abnormal findings: Secondary | ICD-10-CM | POA: Diagnosis not present

## 2019-05-03 DIAGNOSIS — R1084 Generalized abdominal pain: Secondary | ICD-10-CM | POA: Diagnosis not present

## 2019-05-05 DIAGNOSIS — M9903 Segmental and somatic dysfunction of lumbar region: Secondary | ICD-10-CM | POA: Diagnosis not present

## 2019-05-05 DIAGNOSIS — M9902 Segmental and somatic dysfunction of thoracic region: Secondary | ICD-10-CM | POA: Diagnosis not present

## 2019-05-05 DIAGNOSIS — M5134 Other intervertebral disc degeneration, thoracic region: Secondary | ICD-10-CM | POA: Diagnosis not present

## 2019-05-05 DIAGNOSIS — M62830 Muscle spasm of back: Secondary | ICD-10-CM | POA: Diagnosis not present

## 2019-05-23 ENCOUNTER — Other Ambulatory Visit: Payer: Self-pay | Admitting: Cardiovascular Disease

## 2019-05-26 ENCOUNTER — Other Ambulatory Visit: Payer: Self-pay

## 2019-05-26 MED ORDER — ROSUVASTATIN CALCIUM 5 MG PO TABS: ORAL_TABLET | ORAL | 0 refills | Status: DC

## 2019-05-26 NOTE — Telephone Encounter (Signed)
*  STAT* If patient is at the pharmacy, call can be transferred to refill team.   1. Which medications need to be refilled? (please list name of each medication and dose if known) Crestor  2. Which pharmacy/location (including street and city if local pharmacy) is medication to be sent to? [place]  3. Do they need a 30 day or 90 day supply? [place]

## 2019-05-29 NOTE — Progress Notes (Signed)
Cardiology Office Note  Date:  05/31/2019   ID:  Nicole Zuniga, Nicole Zuniga 12-Feb-1976, MRN [id number]  PCP:  [name], MD   Chief Complaint  Patient presents with  . office visit    12 mo F/U; Meds verbally reviewed with patient.    HPI:  44 y.o. female w/ a h/o  RA, on enbrel anxiety, panic attacks,  admitted 1/16 with chest pain and NSTEMI -  trop peaked @ 4.38.  suggestive but not conclusive of spontaneous coronary artery dissection. Echo 1/17 showed nl EF w/o wma's. Presents for follow-up after recent hospitalization for non-STEMI, coronary artery disease  Feels well, Palpitations at night when laying down, 5x a year  No regular exercise program Denies shortness of breath on exertion Feels her weight has trended upwards  Lab work reviewed Total chol 118, LDL 62 HBA1C 5.5   Discussed prior Cardiac catheterization performed 05/02/2017 Images pulled up in the office,   Mid Cx lesion is 50% stenosed.  The left ventricular systolic function is normal.  LV end diastolic pressure is mildly elevated.  The left ventricular ejection fraction is 55-65% by visual estimate.   1.  Moderate one-vessel coronary artery disease with 50% stenosis in the mid left circumflex that has an appearance that is suggestive but not conclusive of spontaneous coronary artery dissection. 2.  Normal LV systolic function and mildly elevated left ventricular end-diastolic pressure.  EKG personally reviewed by myself on todays visit Shows normal sinus rhythm rate 71 bpm nonspecific ST-T wave abnormality   PMH:   has a past medical history of Allergic rhinitis, Anxiety, Basal cell carcinoma (2006), Essential hypertension, Non-ST elevation (NSTEMI) myocardial infarction ([place]) (04/2017), Panic attack, and RA (rheumatoid arthritis) ([place]).  PSH:    Past Surgical History:  Procedure Laterality Date  . HERNIA REPAIR  07/2010   LIH  . KNEE SURGERY Left   . LEFT HEART CATH AND CORONARY  ANGIOGRAPHY N/A 05/02/2017   Procedure: LEFT HEART CATH AND CORONARY ANGIOGRAPHY;  Surgeon: Wellington Hampshire, MD;  Location: [facility];  Service: Cardiovascular;  Laterality: N/A;  . NECK SURGERY     Excision of lymph node    Current Outpatient Medications  Medication Sig Dispense Refill  . ALPRAZolam (XANAX) 0.25 MG tablet Take 0.25 mg by mouth at bedtime as needed for anxiety.   5  . ASPIRIN LOW DOSE 81 MG EC tablet TAKE 1 TABLET BY MOUTH EVERY DAY 30 tablet 0  . bisoprolol (ZEBETA) 5 MG tablet Take 5 mg by mouth daily.  0  . Cholecalciferol (VITAMIN D) 2000 units tablet Take 2,000 Units by mouth daily.    Scarlette Shorts SURECLICK 50 MG/ML injection Inject 50 mg into the skin once a week. Takes on the weekend  2  . levocetirizine (XYZAL) 5 MG tablet Take 5 mg by mouth daily.    Marland Kitchen losartan (COZAAR) 25 MG tablet TAKE 1 TABLET BY MOUTH DAILY 90 tablet 3  . Multiple Vitamin (MULTI-VITAMINS) TABS Take 1 tablet by mouth daily.    . rosuvastatin (CRESTOR) 5 MG tablet TAKE 1 TABLET(5 MG) BY MOUTH DAILY 30 tablet 0   No current facility-administered medications for this visit.    Allergies:   Lisinopril   Social History:  The patient  reports that she has never smoked. She has never used smokeless tobacco. She reports current alcohol use. She reports that she does not use drugs.   Family History:   family history includes CAD in her  father; Congestive Heart Failure in her father; Diabetes in her mother and sister; Hyperlipidemia in her father and mother; Hypertension in her father and mother; Leukemia in her paternal grandfather.    Review of Systems: Review of Systems  Constitutional: Negative.   HENT: Negative.   Respiratory: Negative.   Cardiovascular: Negative.   Gastrointestinal: Negative.   Musculoskeletal: Negative.   Neurological: Negative.   Psychiatric/Behavioral: Negative.   All other systems reviewed and are negative.   PHYSICAL EXAM: VS:  BP 140/80 (BP Location:  Left Arm, Patient Position: Sitting, Cuff Size: Large)   Pulse 71   Ht 5\' 9"  (1.753 m)   Wt 202 lb 12 oz (92 kg)   SpO2 98%   BMI 29.94 kg/m  , BMI Body mass index is 29.94 kg/m. Constitutional:  oriented to person, place, and time. No distress.  HENT:  Head: Grossly normal Eyes:  no discharge. No scleral icterus.  Neck: No JVD, no carotid bruits  Cardiovascular: Regular rate and rhythm, no murmurs appreciated Pulmonary/Chest: Clear to auscultation bilaterally, no wheezes or rails Abdominal: Soft.  no distension.  no tenderness.  Musculoskeletal: Normal range of motion Neurological:  normal muscle tone. Coordination normal. No atrophy Skin: Skin warm and dry Psychiatric: normal affect, pleasant  Recent Labs: No results found for requested labs within last 8760 hours.    Lipid Panel No results found for: CHOL, HDL, LDLCALC, TRIG    Wt Readings from Last 3 Encounters:  05/31/19 202 lb 12 oz (92 kg)  06/04/18 192 lb (87.1 kg)  02/17/18 185 lb (83.9 kg)      ASSESSMENT AND PLAN:  NSTEMI (non-ST elevated myocardial infarction) ([place])  Felt to be secondary to coronary dissection mid left circumflex nonocclusive Continue aspirin, Crestor, beta-blocker, losartan No further testing needed   Essential hypertension - Plan: EKG 12-Lead Blood pressure is well controlled on today's visit. No changes made to the medications.  Rheumatoid arthritis, involving unspecified site, unspecified rheumatoid factor presence ([place]) Managed by primary care and rheumatology Denies any recent flareups  Disposition:   F/U  12 months   Total encounter time more than 25 minutes  Greater than 50% was spent in counseling and coordination of care with the patient   No orders of the defined types were placed in this encounter.    Signed, Esmond Plants, M.D., Ph.D. 05/31/2019  [place], [place]

## 2019-05-31 ENCOUNTER — Encounter: Payer: Self-pay | Admitting: Cardiovascular Disease

## 2019-05-31 ENCOUNTER — Other Ambulatory Visit: Payer: Self-pay

## 2019-05-31 ENCOUNTER — Ambulatory Visit (INDEPENDENT_AMBULATORY_CARE_PROVIDER_SITE_OTHER): Payer: BC Managed Care – PPO | Admitting: Cardiovascular Disease

## 2019-05-31 VITALS — BP 140/80 | HR 71 | Ht 69.0 in | Wt 202.8 lb

## 2019-05-31 DIAGNOSIS — I25118 Atherosclerotic heart disease of native coronary artery with other forms of angina pectoris: Secondary | ICD-10-CM | POA: Diagnosis not present

## 2019-05-31 DIAGNOSIS — M62830 Muscle spasm of back: Secondary | ICD-10-CM | POA: Diagnosis not present

## 2019-05-31 DIAGNOSIS — I10 Essential (primary) hypertension: Secondary | ICD-10-CM

## 2019-05-31 DIAGNOSIS — M9903 Segmental and somatic dysfunction of lumbar region: Secondary | ICD-10-CM | POA: Diagnosis not present

## 2019-05-31 DIAGNOSIS — M5134 Other intervertebral disc degeneration, thoracic region: Secondary | ICD-10-CM | POA: Diagnosis not present

## 2019-05-31 DIAGNOSIS — M9902 Segmental and somatic dysfunction of thoracic region: Secondary | ICD-10-CM | POA: Diagnosis not present

## 2019-05-31 MED ORDER — ROSUVASTATIN CALCIUM 5 MG PO TABS: ORAL_TABLET | ORAL | 11 refills | Status: DC

## 2019-05-31 NOTE — Patient Instructions (Addendum)

## 2019-06-01 ENCOUNTER — Other Ambulatory Visit: Payer: Self-pay | Admitting: Internal Medicine

## 2019-06-01 DIAGNOSIS — R1084 Generalized abdominal pain: Secondary | ICD-10-CM

## 2019-06-08 ENCOUNTER — Encounter: Payer: Self-pay | Admitting: Certified Nurse Midwife

## 2019-06-08 ENCOUNTER — Other Ambulatory Visit: Payer: Self-pay | Admitting: Certified Nurse Midwife

## 2019-06-08 ENCOUNTER — Ambulatory Visit: Payer: BLUE CROSS/BLUE SHIELD | Admitting: Certified Nurse Midwife

## 2019-06-08 ENCOUNTER — Ambulatory Visit (INDEPENDENT_AMBULATORY_CARE_PROVIDER_SITE_OTHER): Payer: BC Managed Care – PPO | Admitting: Certified Nurse Midwife

## 2019-06-08 ENCOUNTER — Other Ambulatory Visit: Payer: Self-pay

## 2019-06-08 VITALS — BP 140/100 | HR 66 | Ht 69.0 in | Wt 204.0 lb

## 2019-06-08 DIAGNOSIS — Z01419 Encounter for gynecological examination (general) (routine) without abnormal findings: Secondary | ICD-10-CM | POA: Diagnosis not present

## 2019-06-08 DIAGNOSIS — N921 Excessive and frequent menstruation with irregular cycle: Secondary | ICD-10-CM

## 2019-06-08 DIAGNOSIS — Z1231 Encounter for screening mammogram for malignant neoplasm of breast: Secondary | ICD-10-CM

## 2019-06-08 DIAGNOSIS — N951 Menopausal and female climacteric states: Secondary | ICD-10-CM | POA: Diagnosis not present

## 2019-06-08 NOTE — Progress Notes (Incomplete)
? ? ? ? ?  Gynecology Annual Exam  ?PCP: [name], MD ? ?Chief Complaint:  ?No chief complaint on file. ? ? ?History of Present Illness:Nicole Zuniga is a 44 year old Caucasian/White female, G3 P3003, who presents for her annual exam.  ?Her menses are usually regular and her LMP was 05/15/2018. They occur every month,  they last 6-7 days, are medium flow (on heavier days changes tampons q2-3hours), and are without clots. She did have a menses that lasted 2 1/2 to 3 weeks starting the last week of December 2019. ?She denies dysmenorrhea.  ?The patient's past medical history is notable for a history of hypertension, rheumatoid arthritis, basal cell carcinoma, anxiety and allergic rhinitis.. In January 2019, she had a mild non ST elevated MI  and was also treated for ventricular tachycardia  ? ?Since her last annual GYN exam dated 06/04/18, she has gained 15#. She also reports having occasional SUI and UI. Denies dysuria, hematuria or foul smelling urine. ? She is sexually active. She is currently using a vasectomy for contraception.  ?Her most recent pap smear was obtained 05/06/2017 and was NIL. ?Her most recent mammogram obtained on 06/10/2017 was normal.  ?There is a positive history of breast cancer in her maternal aunt. Genetic testing has not been done.  ?There is no family history of ovarian cancer.  ?The patient does do monthly self breast exams.  ?The patient does not smoke.  ?The patient does drink infrequently.  ?The patient does not use illegal drugs.  ?The patient exercises 4-5 x/week doing Fit Rise workouts ?The patient does get adequate calcium in her diet.  ?She had a recent cholesterol screen in 03/2018 that was normal.  ? ?The patient denies current symptoms of depression.   ? ?Review of Systems: Review of Systems  ?Constitutional: Negative for chills, fever and weight loss.  ?HENT: Negative for congestion, sinus pain and sore throat.   ?Eyes: Negative for blurred vision and pain.  ? Respiratory: Negative for hemoptysis, shortness of breath an

## 2019-06-08 NOTE — Progress Notes (Signed)
Gynecology Annual Exam  PCP: [name], MD  Chief Complaint:  Chief Complaint  Patient presents with  . Gynecologic Exam    bleeding more than she wants to - now once a month    History of Present Illness:Nicole Zuniga is a 44 year old Caucasian/White female, G3 P3003, who presents for her annual exam.  Her menses were irregular at the start of 2020, and she did not have a menses for 6 months. Her menses then resumed , but were irregular, every 3-7 weeks apart.  She reports that when she started taking black cohash for hot flashes and her menses seemed to regulate after that, coming every month and lasting 7 days with 2 heavier days requring a tampon change every 2 hours. Occasionally will have clots.  She denies dysmenorrhea.  The patient's past medical history is notable for a history of hypertension, rheumatoid arthritis, basal cell carcinoma, anxiety and allergic rhinitis.. In January 2019, she had a mild non ST elevated MI  and was also treated for ventricular tachycardia   Since her last annual GYN exam dated 06/04/18 , she reports having left hip pain that radiates to her SI area and tailbone. Her PCp has ordered a orthopedic consult  She is sexually active. She is currently using a vasectomy for contraception.  Her most recent pap smear was obtained 06/04/18 and was NIL. Her most recent mammogram obtained on 06/30/2018 and after additional views and an ultrasound, her mammogram was Birads 2 for a right breast cyst. There is a positive history of breast cancer in her maternal aunt. Genetic testing has not been done.  There is no family history of ovarian cancer.  The patient does do monthly self breast exams.  The patient does not smoke.  The patient does drink infrequently.  The patient does not use illegal drugs.  The patient exercises 4-5 x/week doing [employer] workouts The patient does get adequate calcium in her diet.  She had a recent cholesterol screen in  2020 that was normal.   The patient denies current symptoms of depression.    Review of Systems: Review of Systems  Constitutional: Negative for chills, fever and weight loss.  HENT: Negative for congestion, sinus pain and sore throat.   Eyes: Positive for blurred vision. Negative for pain.  Respiratory: Negative for hemoptysis, shortness of breath and wheezing.   Cardiovascular: Negative for chest pain, palpitations and leg swelling.  Gastrointestinal: Negative for abdominal pain, blood in stool, diarrhea, heartburn, nausea and vomiting.  Genitourinary: Negative for dysuria, frequency, hematuria and urgency.       Some SUI and UI  Musculoskeletal: Positive for joint pain (left hip). Negative for back pain and myalgias.  Skin: Negative for itching and rash.  Neurological: Negative for dizziness, tingling and headaches.  Endo/Heme/Allergies: Positive for environmental allergies. Negative for polydipsia. Does not bruise/bleed easily.       Negative for hirsutism   Psychiatric/Behavioral: Negative for depression. The patient is nervous/anxious. The patient does not have insomnia.     Past Medical History:  Past Medical History:  Diagnosis Date  . Allergic rhinitis   . Anxiety   . Basal cell carcinoma 2006   left superior lateral chest at breast  . Essential hypertension   . Non-ST elevation (NSTEMI) myocardial infarction ([place]) 04/2017  . Panic attack   . RA (rheumatoid arthritis) ([place])     Past Surgical History:  Past Surgical History:  Procedure Laterality Date  .  HERNIA REPAIR  07/2010   LIH  . KNEE SURGERY Left   . LEFT HEART CATH AND CORONARY ANGIOGRAPHY N/A 05/02/2017   Procedure: LEFT HEART CATH AND CORONARY ANGIOGRAPHY;  Surgeon: Wellington Hampshire, MD;  Location: [facility];  Service: Cardiovascular;  Laterality: N/A;  . NECK SURGERY     Excision of lymph node    Family History:  Family History  Problem Relation Age of Onset  . Hypertension Mother   .  Hyperlipidemia Mother   . Diabetes Mother   . CAD Father        s/p CABG in his late 67's  . Hypertension Father   . Hyperlipidemia Father   . Congestive Heart Failure Father   . Diabetes Sister   . Leukemia Paternal Grandfather    OB History  Gravida Para Term Preterm AB Living  3 3 3     3   SAB TAB Ectopic Multiple Live Births          3    # Outcome Date GA Lbr Len/2nd Weight Sex Delivery Anes PTL Lv  3 Term 11/11/09 [redacted]w[redacted]d  7 lb (3.175 kg) M Vag-Spont   LIV     Complications: Low amniotic fluid, Chronic hypertension affecting pregnancy  2 Term 04/21/06 [redacted]w[redacted]d  8 lb 5 oz (3.771 kg) M Vag-Spont   LIV  1 Term 06/18/04 [redacted]w[redacted]d  8 lb (3.629 kg) F Vag-Spont   LIV   Social History:  Social History   Socioeconomic History  . Marital status: Married    Spouse name: Mallie Mussel  . Number of children: 3  . Years of education: Not on file  . Highest education level: Not on file  Occupational History  . Occupation: Landscape architect  Tobacco Use  . Smoking status: Never Smoker  . Smokeless tobacco: Never Used  Substance and Sexual Activity  . Alcohol use: Yes    Comment: rare  . Drug use: No  . Sexual activity: Yes    Partners: Male    Birth control/protection: Other-see comments    Comment: Vasectomy  Other Topics Concern  . Not on file  Social History Narrative   Lives locally with her husband and three children (ages 4, 18, 61).  Works as Landscape architect.  Exercises regularly.   Social Determinants of Health   Financial Resource Strain:   . Difficulty of Paying Living Expenses: Not on file  Food Insecurity:   . Worried About Charity fundraiser in the Last Year: Not on file  . Ran Out of Food in the Last Year: Not on file  Transportation Needs:   . Lack of Transportation (Medical): Not on file  . Lack of Transportation (Non-Medical): Not on file  Physical Activity:   . Days of Exercise per Week: Not on file  . Minutes of Exercise per Session: Not on file  Stress:   .  Feeling of Stress : Not on file  Social Connections:   . Frequency of Communication with Friends and Family: Not on file  . Frequency of Social Gatherings with Friends and Family: Not on file  . Attends Religious Services: Not on file  . Active Member of Clubs or Organizations: Not on file  . Attends Archivist Meetings: Not on file  . Marital Status: Not on file  Intimate Partner Violence:   . Fear of Current or Ex-Partner: Not on file  . Emotionally Abused: Not on file  . Physically Abused: Not on file  .  Sexually Abused: Not on file    Allergies:  Allergies  Allergen Reactions  . Lisinopril     Other reaction(s): Cough, Other (See Comments)    Medications:  Current Outpatient Medications on File Prior to Visit  Medication Sig Dispense Refill  . ALPRAZolam (XANAX) 0.25 MG tablet Take 0.25 mg by mouth at bedtime as needed for anxiety.   5  . ASPIRIN LOW DOSE 81 MG EC tablet TAKE 1 TABLET BY MOUTH EVERY DAY 30 tablet 0  . bisoprolol (ZEBETA) 5 MG tablet Take 5 mg by mouth daily.  0  . Black Cohosh 40 MG CAPS Take 1 capsule by mouth daily.    . Cholecalciferol (VITAMIN D) 2000 units tablet Take 2,000 Units by mouth daily.    Scarlette Shorts SURECLICK 50 MG/ML injection Inject 50 mg into the skin once a week. Takes on the weekend  2  . levocetirizine (XYZAL) 5 MG tablet Take 5 mg by mouth daily.    Marland Kitchen losartan (COZAAR) 25 MG tablet TAKE 1 TABLET BY MOUTH DAILY 90 tablet 3  . Multiple Vitamin (MULTI-VITAMINS) TABS Take 1 tablet by mouth daily.    . rosuvastatin (CRESTOR) 5 MG tablet TAKE 1 TABLET(5 MG) BY MOUTH DAILY 30 tablet 11   No current facility-administered medications on file prior to visit.   Physical Exam Vitals: BP (!) 140/100   Pulse 66   Ht 5\' 9"  (1.753 m)   Wt 204 lb (92.5 kg)   LMP 05/21/2019 (Exact Date)   BMI 30.13 kg/m .  General: WF in NAD HEENT: normocephalic, anicteric Neck: no thyroid enlargement, no palpable nodules, no cervical lymphadenopathy    Pulmonary: No increased work of breathing, CTAB Cardiovascular: RRR, without murmur  Breast: Breast symmetrical, no tenderness, no palpable nodules or masses, no skin changes. Inverted nipples bilaterally (no change),  no nipple discharge.  No axillary, infraclavicular or supraclavicular lymphadenopathy. Abdomen: Soft, non-tender, non-distended.  Umbilicus without lesions.  No hepatomegaly or masses palpable. No evidence of hernia. Genitourinary:  External: Normal external female genitalia.  Normal urethral meatus, normal Bartholin's and Skene's glands.    Vagina: Normal vaginal mucosa, mild rectocele   Cervix: Grossly normal in appearance, no bleeding, non-tender  Uterus: Retroflexed, normal size, shape, and consistency, mobile, and non-tender  Adnexa: No adnexal masses, non-tender  Rectal: deferred  Lymphatic: no evidence of inguinal lymphadenopathy Extremities: no edema, erythema, or tenderness Neurologic: Grossly intact Psychiatric: mood appropriate, affect full     Assessment: 44 y.o. normal gyn exam Menstrual irregularities/ hot flashes-probably perimenopausal  Plan:    1) Breast cancer screening - recommend monthly self breast exam and annual screening mammograms. Mammogram has been scheduled for next month.  2) Cervical cancer screening - Pap was not done. ASCCP guidelines and rational discussed.  Patient opts for every 3 years screening interval  3) Encouraged Kegel exercises  4) Routine healthcare maintenance including cholesterol and diabetes screening managed by PCP   5.)RTO 1 year and prn  Dalia Heading, CNM

## 2019-06-16 DIAGNOSIS — M5134 Other intervertebral disc degeneration, thoracic region: Secondary | ICD-10-CM | POA: Diagnosis not present

## 2019-06-16 DIAGNOSIS — M9902 Segmental and somatic dysfunction of thoracic region: Secondary | ICD-10-CM | POA: Diagnosis not present

## 2019-06-16 DIAGNOSIS — M62830 Muscle spasm of back: Secondary | ICD-10-CM | POA: Diagnosis not present

## 2019-06-16 DIAGNOSIS — M9903 Segmental and somatic dysfunction of lumbar region: Secondary | ICD-10-CM | POA: Diagnosis not present

## 2019-06-22 DIAGNOSIS — Z79899 Other long term (current) drug therapy: Secondary | ICD-10-CM | POA: Diagnosis not present

## 2019-06-22 DIAGNOSIS — Z6829 Body mass index (BMI) 29.0-29.9, adult: Secondary | ICD-10-CM | POA: Diagnosis not present

## 2019-06-22 DIAGNOSIS — M0880 Other juvenile arthritis, unspecified site: Secondary | ICD-10-CM | POA: Diagnosis not present

## 2019-06-23 ENCOUNTER — Other Ambulatory Visit: Payer: Self-pay | Admitting: Cardiovascular Disease

## 2019-06-23 DIAGNOSIS — M25852 Other specified joint disorders, left hip: Secondary | ICD-10-CM | POA: Diagnosis not present

## 2019-06-23 DIAGNOSIS — M25552 Pain in left hip: Secondary | ICD-10-CM | POA: Diagnosis not present

## 2019-06-23 DIAGNOSIS — M7062 Trochanteric bursitis, left hip: Secondary | ICD-10-CM | POA: Diagnosis not present

## 2019-07-01 ENCOUNTER — Ambulatory Visit
Admission: RE | Admit: 2019-07-01 | Discharge: 2019-07-01 | Disposition: A | Discharge status: Home / Self Care | Payer: BC Managed Care – PPO | Source: Ambulatory Visit | Attending: Certified Nurse Midwife | Admitting: Certified Nurse Midwife

## 2019-07-01 DIAGNOSIS — Z1231 Encounter for screening mammogram for malignant neoplasm of breast: Secondary | ICD-10-CM | POA: Diagnosis not present

## 2019-07-06 ENCOUNTER — Ambulatory Visit
Admission: RE | Admit: 2019-07-06 | Discharge: 2019-07-06 | Disposition: A | Discharge status: Home / Self Care | Payer: BC Managed Care – PPO | Source: Ambulatory Visit | Attending: Internal Medicine | Admitting: Internal Medicine

## 2019-07-06 ENCOUNTER — Other Ambulatory Visit: Payer: Self-pay

## 2019-07-06 DIAGNOSIS — R1084 Generalized abdominal pain: Secondary | ICD-10-CM | POA: Insufficient documentation

## 2019-07-06 DIAGNOSIS — Z8249 Family history of ischemic heart disease and other diseases of the circulatory system: Secondary | ICD-10-CM | POA: Diagnosis not present

## 2019-07-07 DIAGNOSIS — M62830 Muscle spasm of back: Secondary | ICD-10-CM | POA: Diagnosis not present

## 2019-07-07 DIAGNOSIS — M5134 Other intervertebral disc degeneration, thoracic region: Secondary | ICD-10-CM | POA: Diagnosis not present

## 2019-07-07 DIAGNOSIS — M9902 Segmental and somatic dysfunction of thoracic region: Secondary | ICD-10-CM | POA: Diagnosis not present

## 2019-07-07 DIAGNOSIS — M9903 Segmental and somatic dysfunction of lumbar region: Secondary | ICD-10-CM | POA: Diagnosis not present

## 2019-07-08 DIAGNOSIS — C44712 Basal cell carcinoma of skin of right lower limb, including hip: Secondary | ICD-10-CM | POA: Diagnosis not present

## 2019-07-28 DIAGNOSIS — M7062 Trochanteric bursitis, left hip: Secondary | ICD-10-CM | POA: Diagnosis not present

## 2019-07-28 DIAGNOSIS — M25852 Other specified joint disorders, left hip: Secondary | ICD-10-CM | POA: Diagnosis not present

## 2019-08-04 DIAGNOSIS — M9903 Segmental and somatic dysfunction of lumbar region: Secondary | ICD-10-CM | POA: Diagnosis not present

## 2019-08-04 DIAGNOSIS — M9902 Segmental and somatic dysfunction of thoracic region: Secondary | ICD-10-CM | POA: Diagnosis not present

## 2019-08-04 DIAGNOSIS — M5134 Other intervertebral disc degeneration, thoracic region: Secondary | ICD-10-CM | POA: Diagnosis not present

## 2019-08-04 DIAGNOSIS — M62830 Muscle spasm of back: Secondary | ICD-10-CM | POA: Diagnosis not present

## 2019-08-25 DIAGNOSIS — M62830 Muscle spasm of back: Secondary | ICD-10-CM | POA: Diagnosis not present

## 2019-08-25 DIAGNOSIS — M9903 Segmental and somatic dysfunction of lumbar region: Secondary | ICD-10-CM | POA: Diagnosis not present

## 2019-08-25 DIAGNOSIS — M9902 Segmental and somatic dysfunction of thoracic region: Secondary | ICD-10-CM | POA: Diagnosis not present

## 2019-08-25 DIAGNOSIS — M5134 Other intervertebral disc degeneration, thoracic region: Secondary | ICD-10-CM | POA: Diagnosis not present

## 2019-09-15 DIAGNOSIS — M9902 Segmental and somatic dysfunction of thoracic region: Secondary | ICD-10-CM | POA: Diagnosis not present

## 2019-09-15 DIAGNOSIS — M9903 Segmental and somatic dysfunction of lumbar region: Secondary | ICD-10-CM | POA: Diagnosis not present

## 2019-09-15 DIAGNOSIS — M62830 Muscle spasm of back: Secondary | ICD-10-CM | POA: Diagnosis not present

## 2019-09-15 DIAGNOSIS — M5134 Other intervertebral disc degeneration, thoracic region: Secondary | ICD-10-CM | POA: Diagnosis not present

## 2019-09-30 DIAGNOSIS — D2271 Melanocytic nevi of right lower limb, including hip: Secondary | ICD-10-CM | POA: Diagnosis not present

## 2019-09-30 DIAGNOSIS — D2262 Melanocytic nevi of left upper limb, including shoulder: Secondary | ICD-10-CM | POA: Diagnosis not present

## 2019-09-30 DIAGNOSIS — D225 Melanocytic nevi of trunk: Secondary | ICD-10-CM | POA: Diagnosis not present

## 2019-09-30 DIAGNOSIS — D2261 Melanocytic nevi of right upper limb, including shoulder: Secondary | ICD-10-CM | POA: Diagnosis not present

## 2019-09-30 DIAGNOSIS — C44519 Basal cell carcinoma of skin of other part of trunk: Secondary | ICD-10-CM | POA: Diagnosis not present

## 2019-09-30 DIAGNOSIS — D485 Neoplasm of uncertain behavior of skin: Secondary | ICD-10-CM | POA: Diagnosis not present

## 2019-10-06 DIAGNOSIS — M9902 Segmental and somatic dysfunction of thoracic region: Secondary | ICD-10-CM | POA: Diagnosis not present

## 2019-10-06 DIAGNOSIS — M62830 Muscle spasm of back: Secondary | ICD-10-CM | POA: Diagnosis not present

## 2019-10-06 DIAGNOSIS — M5134 Other intervertebral disc degeneration, thoracic region: Secondary | ICD-10-CM | POA: Diagnosis not present

## 2019-10-06 DIAGNOSIS — M9903 Segmental and somatic dysfunction of lumbar region: Secondary | ICD-10-CM | POA: Diagnosis not present

## 2019-10-20 DIAGNOSIS — M9902 Segmental and somatic dysfunction of thoracic region: Secondary | ICD-10-CM | POA: Diagnosis not present

## 2019-10-20 DIAGNOSIS — M62830 Muscle spasm of back: Secondary | ICD-10-CM | POA: Diagnosis not present

## 2019-10-20 DIAGNOSIS — M5134 Other intervertebral disc degeneration, thoracic region: Secondary | ICD-10-CM | POA: Diagnosis not present

## 2019-10-20 DIAGNOSIS — M9903 Segmental and somatic dysfunction of lumbar region: Secondary | ICD-10-CM | POA: Diagnosis not present

## 2019-11-10 DIAGNOSIS — M5134 Other intervertebral disc degeneration, thoracic region: Secondary | ICD-10-CM | POA: Diagnosis not present

## 2019-11-10 DIAGNOSIS — M9902 Segmental and somatic dysfunction of thoracic region: Secondary | ICD-10-CM | POA: Diagnosis not present

## 2019-11-10 DIAGNOSIS — M62830 Muscle spasm of back: Secondary | ICD-10-CM | POA: Diagnosis not present

## 2019-11-10 DIAGNOSIS — M9903 Segmental and somatic dysfunction of lumbar region: Secondary | ICD-10-CM | POA: Diagnosis not present

## 2019-11-11 DIAGNOSIS — C44519 Basal cell carcinoma of skin of other part of trunk: Secondary | ICD-10-CM | POA: Diagnosis not present

## 2019-11-24 DIAGNOSIS — M9903 Segmental and somatic dysfunction of lumbar region: Secondary | ICD-10-CM | POA: Diagnosis not present

## 2019-11-24 DIAGNOSIS — M62830 Muscle spasm of back: Secondary | ICD-10-CM | POA: Diagnosis not present

## 2019-11-24 DIAGNOSIS — M5134 Other intervertebral disc degeneration, thoracic region: Secondary | ICD-10-CM | POA: Diagnosis not present

## 2019-11-24 DIAGNOSIS — M9902 Segmental and somatic dysfunction of thoracic region: Secondary | ICD-10-CM | POA: Diagnosis not present

## 2019-12-15 DIAGNOSIS — M9902 Segmental and somatic dysfunction of thoracic region: Secondary | ICD-10-CM | POA: Diagnosis not present

## 2019-12-15 DIAGNOSIS — M9903 Segmental and somatic dysfunction of lumbar region: Secondary | ICD-10-CM | POA: Diagnosis not present

## 2019-12-15 DIAGNOSIS — M62830 Muscle spasm of back: Secondary | ICD-10-CM | POA: Diagnosis not present

## 2019-12-15 DIAGNOSIS — M5134 Other intervertebral disc degeneration, thoracic region: Secondary | ICD-10-CM | POA: Diagnosis not present

## 2019-12-30 DIAGNOSIS — Z20822 Contact with and (suspected) exposure to covid-19: Secondary | ICD-10-CM | POA: Diagnosis not present

## 2020-01-12 DIAGNOSIS — M9902 Segmental and somatic dysfunction of thoracic region: Secondary | ICD-10-CM | POA: Diagnosis not present

## 2020-01-12 DIAGNOSIS — M9903 Segmental and somatic dysfunction of lumbar region: Secondary | ICD-10-CM | POA: Diagnosis not present

## 2020-01-12 DIAGNOSIS — M5134 Other intervertebral disc degeneration, thoracic region: Secondary | ICD-10-CM | POA: Diagnosis not present

## 2020-01-12 DIAGNOSIS — M62830 Muscle spasm of back: Secondary | ICD-10-CM | POA: Diagnosis not present

## 2020-01-28 ENCOUNTER — Telehealth: Payer: Self-pay

## 2020-01-28 DIAGNOSIS — N939 Abnormal uterine and vaginal bleeding, unspecified: Secondary | ICD-10-CM

## 2020-01-28 NOTE — Telephone Encounter (Signed)
Spoke w/patient. Advised to schedule apt to discuss w/new provider. Likely will do labs and discuss options. Apt scheduled w/[facility] 02/02/20 @2 :10

## 2020-01-28 NOTE — Telephone Encounter (Signed)
Pt needs GYN u/s. Can you add to do before appt with me? Pls notify pt. Thx.

## 2020-01-28 NOTE — Telephone Encounter (Signed)
Patient aware of ultrasound added on

## 2020-01-28 NOTE — Telephone Encounter (Signed)
Patient reports she is in pre menopause and has been bleeding for 3-4 weeks now. It's light, but it is heavier than spotting. CLG had advised her if this happened to call. Cb#[phone] work

## 2020-01-31 NOTE — Addendum Note (Signed)
Addended by: Ardeth Perfect B on: 98/33/8250 11:57 AM   Modules accepted: Orders

## 2020-02-02 ENCOUNTER — Ambulatory Visit (INDEPENDENT_AMBULATORY_CARE_PROVIDER_SITE_OTHER): Payer: BC Managed Care – PPO | Admitting: Obstetrics and Gynecology

## 2020-02-02 ENCOUNTER — Ambulatory Visit (INDEPENDENT_AMBULATORY_CARE_PROVIDER_SITE_OTHER): Payer: BC Managed Care – PPO

## 2020-02-02 ENCOUNTER — Other Ambulatory Visit: Payer: Self-pay | Admitting: Obstetrics and Gynecology

## 2020-02-02 ENCOUNTER — Other Ambulatory Visit: Payer: Self-pay

## 2020-02-02 ENCOUNTER — Encounter: Payer: Self-pay | Admitting: Obstetrics and Gynecology

## 2020-02-02 VITALS — BP 130/90 | Ht 69.0 in | Wt 206.0 lb

## 2020-02-02 DIAGNOSIS — N939 Abnormal uterine and vaginal bleeding, unspecified: Secondary | ICD-10-CM

## 2020-02-02 NOTE — Progress Notes (Signed)
[name], MD   Chief Complaint  Patient presents with  . Follow-up    u/s    HPI:      Ms. MATIKA BARTELL is a 44 y.o. [id number] whose LMP was Patient's last menstrual period was 01/02/2020 (approximate)., presents today for AUB eval and u/s f/u. Menses have been Q2wks to 2 months, lasting 6-7 days or up to 4 wks (this cycle, stopped a few days ago) since her last annual 2/21. She changes products Q2 hrs with heavier flow, also with stringy clots. No dysmen. Hx of irreg menses off and on since early 2020. Did go 6 months without bleeding last yr. Hx of HTN, mild non STEMI 1/19. Taking black cohosh for vasomotor sx with some relief. Did OCPs in distant past without side effects. Neg thyroid 12/20. She is sex active, using vasectomy. No bleeding/pain.  Last pap was neg 2/20. Last annual 2/21.  Past Medical History:  Diagnosis Date  . Allergic rhinitis   . Anxiety   . Basal cell carcinoma 2006   left superior lateral chest at breast  . Essential hypertension   . Non-ST elevation (NSTEMI) myocardial infarction ([place]) 04/2017  . Panic attack   . RA (rheumatoid arthritis) ([place])     Past Surgical History:  Procedure Laterality Date  . HERNIA REPAIR  07/2010   LIH  . KNEE SURGERY Left   . LEFT HEART CATH AND CORONARY ANGIOGRAPHY N/A 05/02/2017   Procedure: LEFT HEART CATH AND CORONARY ANGIOGRAPHY;  Surgeon: Wellington Hampshire, MD;  Location: [facility];  Service: Cardiovascular;  Laterality: N/A;  . NECK SURGERY     Excision of lymph node    Family History  Problem Relation Age of Onset  . Hypertension Mother   . Hyperlipidemia Mother   . Diabetes Mother   . CAD Father        s/p CABG in his late 41's  . Hypertension Father   . Hyperlipidemia Father   . Congestive Heart Failure Father   . Diabetes Sister   . Leukemia Paternal Grandfather     Social History   Socioeconomic History  . Marital status: Married    Spouse name: Mallie Mussel  . Number of  children: 3  . Years of education: Not on file  . Highest education level: Not on file  Occupational History  . Occupation: Landscape architect  Tobacco Use  . Smoking status: Never Smoker  . Smokeless tobacco: Never Used  Vaping Use  . Vaping Use: Never used  Substance and Sexual Activity  . Alcohol use: Yes    Comment: rare  . Drug use: No  . Sexual activity: Yes    Partners: Male    Birth control/protection: Other-see comments    Comment: Vasectomy  Other Topics Concern  . Not on file  Social History Narrative   Lives locally with her husband and three children (ages 45, 82, 44).  Works as Landscape architect.  Exercises regularly.   Social Determinants of Health   Financial Resource Strain:   . Difficulty of Paying Living Expenses: Not on file  Food Insecurity:   . Worried About Charity fundraiser in the Last Year: Not on file  . Ran Out of Food in the Last Year: Not on file  Transportation Needs:   . Lack of Transportation (Medical): Not on file  . Lack of Transportation (Non-Medical): Not on file  Physical Activity:   . Days of Exercise per Week:  Not on file  . Minutes of Exercise per Session: Not on file  Stress:   . Feeling of Stress : Not on file  Social Connections:   . Frequency of Communication with Friends and Family: Not on file  . Frequency of Social Gatherings with Friends and Family: Not on file  . Attends Religious Services: Not on file  . Active Member of Clubs or Organizations: Not on file  . Attends Archivist Meetings: Not on file  . Marital Status: Not on file  Intimate Partner Violence:   . Fear of Current or Ex-Partner: Not on file  . Emotionally Abused: Not on file  . Physically Abused: Not on file  . Sexually Abused: Not on file    Outpatient Medications Prior to Visit  Medication Sig Dispense Refill  . ALPRAZolam (XANAX) 0.25 MG tablet Take 0.25 mg by mouth at bedtime as needed for anxiety.   5  . ASPIRIN LOW DOSE 81 MG EC tablet  TAKE 1 TABLET BY MOUTH EVERY DAY 30 tablet 10  . bisoprolol (ZEBETA) 5 MG tablet Take 5 mg by mouth daily.  0  . Black Cohosh 40 MG CAPS Take 1 capsule by mouth daily.    . Cholecalciferol (VITAMIN D) 2000 units tablet Take 2,000 Units by mouth daily.    Scarlette Shorts SURECLICK 50 MG/ML injection Inject 50 mg into the skin once a week. Takes on the weekend  2  . levocetirizine (XYZAL) 5 MG tablet Take 5 mg by mouth daily.    Marland Kitchen losartan (COZAAR) 25 MG tablet TAKE 1 TABLET BY MOUTH DAILY 90 tablet 3  . Multiple Vitamin (MULTI-VITAMINS) TABS Take 1 tablet by mouth daily.    . rosuvastatin (CRESTOR) 5 MG tablet TAKE 1 TABLET(5 MG) BY MOUTH DAILY 30 tablet 11   No facility-administered medications prior to visit.      ROS:  Review of Systems  Constitutional: Negative for fever.  Gastrointestinal: Negative for blood in stool, constipation, diarrhea, nausea and vomiting.  Genitourinary: Positive for menstrual problem. Negative for dyspareunia, dysuria, flank pain, frequency, hematuria, urgency, vaginal bleeding, vaginal discharge and vaginal pain.  Musculoskeletal: Negative for back pain.  Skin: Negative for rash.    OBJECTIVE:   Vitals:  BP 130/90   Ht 5\' 9"  (1.753 m)   Wt 206 lb (93.4 kg)   LMP 01/02/2020 (Approximate)   BMI 30.42 kg/m   Physical Exam Vitals reviewed.  Constitutional:      Appearance: She is well-developed.  Pulmonary:     Effort: Pulmonary effort is normal.  Genitourinary:    General: Normal vulva.     Pubic Area: No rash.      Labia:        Right: No rash, tenderness or lesion.        Left: No rash, tenderness or lesion.      Vagina: Normal. No vaginal discharge, erythema or tenderness.     Cervix: Normal.     Uterus: Normal. Not enlarged and not tender.      Adnexa: Right adnexa normal and left adnexa normal.       Right: No mass or tenderness.         Left: No mass or tenderness.    Musculoskeletal:        General: Normal range of motion.     Cervical  back: Normal range of motion.  Skin:    General: Skin is warm and dry.  Neurological:     General:  No focal deficit present.     Mental Status: She is alert and oriented to person, place, and time.  Psychiatric:        Mood and Affect: Mood normal.        Behavior: Behavior normal.        Thought Content: Thought content normal.        Judgment: Judgment normal.     Results:  ULTRASOUND REPORT  Location: [facility]  Date of Service: 02/02/2020    Indications:Abnormal Uterine Bleeding   Findings:  The uterus is retroverted and measures 8.8 x 4.4 x 3.7 cm. Echo texture is homogenous without evidence of focal masses. The Endometrium measures 6.1 mm. There is limited visualization of the endometrium due to uterine position.   Neither ovary is visible.  Survey of the adnexa demonstrates no adnexal masses. There is no free fluid in the cul de sac.  Impression: 1. Limited views of the endometrial canal.  2. Neither ovary is visible.   Recommendations: 1.Clinical correlation with the patient's History and Physical Exam.   Gweneth Dimitri, RT  Assessment/Plan: Abnormal uterine bleeding (AUB) - Plan: TSH + free T4; Neg GYN u/s today. Check thyroid. If sx persist, will do EMB. Discussed perimenopausal sx and hormones to control sx. Pt with HTN and hx of non-STEMI. Discussed cyclic prog, prog only options, IUD, ablation. Pt wants to follow cycles for now. F/u at 2/22 annual or sooner if sx persist.     Return if symptoms worsen or fail to improve.  Ahleah Simko B. Jamelyn Bovard, PA-C 02/02/2020 3:03 PM

## 2020-02-02 NOTE — Patient Instructions (Signed)
I value your feedback and entrusting us with your care. If you get a  patient survey, I would appreciate you taking the time to let us know about your experience today. Thank you!  As of March 25, 2019, your lab results will be released to your MyChart immediately, before I even have a chance to see them. Please give me time to review them and contact you if there are any abnormalities. Thank you for your patience.  

## 2020-02-04 ENCOUNTER — Other Ambulatory Visit: Payer: Self-pay

## 2020-02-04 ENCOUNTER — Other Ambulatory Visit: Payer: BC Managed Care – PPO

## 2020-02-04 ENCOUNTER — Telehealth: Payer: Self-pay | Admitting: Obstetrics and Gynecology

## 2020-02-04 DIAGNOSIS — N939 Abnormal uterine and vaginal bleeding, unspecified: Secondary | ICD-10-CM

## 2020-02-04 NOTE — Telephone Encounter (Signed)
Patient is here for lab today and wanted to let ABC know she has started bleeding again. Please advise

## 2020-02-04 NOTE — Telephone Encounter (Signed)
Please see

## 2020-02-05 LAB — TSH+FREE T4
Free T4: 1.33 ng/dL (ref 0.82–1.77)
TSH: 0.892 u[IU]/mL (ref 0.450–4.500)

## 2020-02-07 NOTE — Telephone Encounter (Signed)
Pt aware.

## 2020-02-07 NOTE — Telephone Encounter (Signed)
Pls let pt know thyroid labs normal. Is she still bleeding or did it stop? Thx.

## 2020-02-07 NOTE — Telephone Encounter (Signed)
Pt to f/u if irreg bleeding recurs for EMB, tx mgmt.

## 2020-02-07 NOTE — Telephone Encounter (Signed)
Called pt, no answer, LVMTRC. 

## 2020-02-07 NOTE — Telephone Encounter (Signed)
Pt aware. Says bleeding has stopped. Pt wonders if bleeding was from u/s?

## 2020-02-09 DIAGNOSIS — R058 Other specified cough: Secondary | ICD-10-CM | POA: Diagnosis not present

## 2020-02-09 DIAGNOSIS — Z23 Encounter for immunization: Secondary | ICD-10-CM | POA: Diagnosis not present

## 2020-02-09 DIAGNOSIS — J45902 Unspecified asthma with status asthmaticus: Secondary | ICD-10-CM | POA: Diagnosis not present

## 2020-02-09 DIAGNOSIS — J40 Bronchitis, not specified as acute or chronic: Secondary | ICD-10-CM | POA: Diagnosis not present

## 2020-02-14 NOTE — Telephone Encounter (Signed)
Pls. call pt to schedule appt..

## 2020-02-14 NOTE — Telephone Encounter (Signed)
Pt needs an appt for EMB.

## 2020-02-14 NOTE — Telephone Encounter (Signed)
Called and left voicemail for patient to call back to be scheduled. 

## 2020-02-14 NOTE — Telephone Encounter (Signed)
Pt calling to let ABC know she has started bleeding; it's internal - only when she wipes; states ABC has an action plan.  [phone]

## 2020-02-14 NOTE — Telephone Encounter (Signed)
Patient is scheduled for Tuesday, 02/22/20 at 3:30 with ABC. Patient has some questions about the procedure. Would you call patient to advise what to expect.

## 2020-02-14 NOTE — Telephone Encounter (Signed)
EMB questions answered.

## 2020-02-16 DIAGNOSIS — M9903 Segmental and somatic dysfunction of lumbar region: Secondary | ICD-10-CM | POA: Diagnosis not present

## 2020-02-16 DIAGNOSIS — M5134 Other intervertebral disc degeneration, thoracic region: Secondary | ICD-10-CM | POA: Diagnosis not present

## 2020-02-16 DIAGNOSIS — M62830 Muscle spasm of back: Secondary | ICD-10-CM | POA: Diagnosis not present

## 2020-02-16 DIAGNOSIS — M9902 Segmental and somatic dysfunction of thoracic region: Secondary | ICD-10-CM | POA: Diagnosis not present

## 2020-02-22 ENCOUNTER — Ambulatory Visit (INDEPENDENT_AMBULATORY_CARE_PROVIDER_SITE_OTHER): Payer: BC Managed Care – PPO | Admitting: Obstetrics and Gynecology

## 2020-02-22 ENCOUNTER — Other Ambulatory Visit (HOSPITAL_COMMUNITY)
Admission: RE | Admit: 2020-02-22 | Discharge: 2020-02-22 | Disposition: A | Discharge status: Home / Self Care | Payer: BC Managed Care – PPO | Source: Ambulatory Visit | Attending: Obstetrics and Gynecology | Admitting: Obstetrics and Gynecology

## 2020-02-22 ENCOUNTER — Encounter: Payer: Self-pay | Admitting: Obstetrics and Gynecology

## 2020-02-22 ENCOUNTER — Other Ambulatory Visit: Payer: Self-pay

## 2020-02-22 VITALS — BP 110/86 | Ht 69.0 in | Wt 203.0 lb

## 2020-02-22 DIAGNOSIS — N939 Abnormal uterine and vaginal bleeding, unspecified: Secondary | ICD-10-CM | POA: Diagnosis not present

## 2020-02-22 NOTE — Patient Instructions (Signed)
I value your feedback and entrusting us with your care. If you get a [facility] patient survey, I would appreciate you taking the time to let us know about your experience today. Thank you!  As of March 25, 2019, your lab results will be released to your MyChart immediately, before I even have a chance to see them. Please give me time to review them and contact you if there are any abnormalities. Thank you for your patience.  

## 2020-02-22 NOTE — Progress Notes (Signed)
Nicole Aus, MD    Chief Complaint  Patient presents with  . Endometrial Biopsy    HPI:      [name] is a 44 y.o. [phone] whose LMP was No LMP recorded. (Menstrual status: Irregular Periods)., presents today for EMB due to AUB.   Seen 10/21. "Menses have been Q2wks to 2 months, lasting 6-7 days or up to 4 wks (this cycle, stopped a few days ago) since her last annual 2/21. She changes products Q2 hrs with heavier flow, also with stringy clots. No dysmen. Hx of irreg menses off and on since early 2020. Did go 6 months without bleeding last yr. Hx of HTN, mild non STEMI 1/19."  Had normal thyroid labs and GYN u/s 10/21. Endometriummeasured 6.1 mm. Pt cont to have AUB, off and on. Not heavy currently. Due for EMB to eval further. Discussed prog only options, IUD, ablation if EMB neg.   Past Medical History:  Diagnosis Date  . Allergic rhinitis   . Anxiety   . Basal cell carcinoma 2006   left superior lateral chest at breast  . Essential hypertension   . Non-ST elevation (NSTEMI) myocardial infarction ([place]) 04/2017  . Panic attack   . RA (rheumatoid arthritis) ([place])     Past Surgical History:  Procedure Laterality Date  . HERNIA REPAIR  07/2010   LIH  . KNEE SURGERY Left   . LEFT HEART CATH AND CORONARY ANGIOGRAPHY N/A 05/02/2017   Procedure: LEFT HEART CATH AND CORONARY ANGIOGRAPHY;  Surgeon: Wellington Hampshire, MD;  Location: [facility];  Service: Cardiovascular;  Laterality: N/A;  . NECK SURGERY     Excision of lymph node    Family History  Problem Relation Age of Onset  . Hypertension Mother   . Hyperlipidemia Mother   . Diabetes Mother   . CAD Father        s/p CABG in his late 61's  . Hypertension Father   . Hyperlipidemia Father   . Congestive Heart Failure Father   . Diabetes Sister   . Leukemia Paternal Grandfather     Social History   Socioeconomic History  . Marital status: Married    Spouse name: Mallie Mussel  . Number of  children: 3  . Years of education: Not on file  . Highest education level: Not on file  Occupational History  . Occupation: Landscape architect  Tobacco Use  . Smoking status: Never Smoker  . Smokeless tobacco: Never Used  Vaping Use  . Vaping Use: Never used  Substance and Sexual Activity  . Alcohol use: Yes    Comment: rare  . Drug use: No  . Sexual activity: Yes    Partners: Male    Birth control/protection: Other-see comments    Comment: Vasectomy  Other Topics Concern  . Not on file  Social History Narrative   Lives locally with her husband and three children (ages 9, 94, 11).  Works as Landscape architect.  Exercises regularly.   Social Determinants of Health   Financial Resource Strain:   . Difficulty of Paying Living Expenses: Not on file  Food Insecurity:   . Worried About Charity fundraiser in the Last Year: Not on file  . Ran Out of Food in the Last Year: Not on file  Transportation Needs:   . Lack of Transportation (Medical): Not on file  . Lack of Transportation (Non-Medical): Not on file  Physical Activity:   . Days of  Exercise per Week: Not on file  . Minutes of Exercise per Session: Not on file  Stress:   . Feeling of Stress : Not on file  Social Connections:   . Frequency of Communication with Friends and Family: Not on file  . Frequency of Social Gatherings with Friends and Family: Not on file  . Attends Religious Services: Not on file  . Active Member of Clubs or Organizations: Not on file  . Attends Archivist Meetings: Not on file  . Marital Status: Not on file  Intimate Partner Violence:   . Fear of Current or Ex-Partner: Not on file  . Emotionally Abused: Not on file  . Physically Abused: Not on file  . Sexually Abused: Not on file    Outpatient Medications Prior to Visit  Medication Sig Dispense Refill  . ALPRAZolam (XANAX) 0.25 MG tablet Take 0.25 mg by mouth at bedtime as needed for anxiety.   5  . ASPIRIN LOW DOSE 81 MG EC tablet  TAKE 1 TABLET BY MOUTH EVERY DAY 30 tablet 10  . bisoprolol (ZEBETA) 5 MG tablet Take 5 mg by mouth daily.  0  . Black Cohosh 40 MG CAPS Take 1 capsule by mouth daily.    . Cholecalciferol (VITAMIN D) 2000 units tablet Take 2,000 Units by mouth daily.    Scarlette Shorts SURECLICK 50 MG/ML injection Inject 50 mg into the skin once a week. Takes on the weekend  2  . fluticasone (FLONASE) 50 MCG/ACT nasal spray Place into the nose.    . levocetirizine (XYZAL) 5 MG tablet Take 5 mg by mouth daily.    Marland Kitchen losartan (COZAAR) 25 MG tablet TAKE 1 TABLET BY MOUTH DAILY 90 tablet 3  . Multiple Vitamin (MULTI-VITAMINS) TABS Take 1 tablet by mouth daily.    . rosuvastatin (CRESTOR) 5 MG tablet TAKE 1 TABLET(5 MG) BY MOUTH DAILY 30 tablet 11   No facility-administered medications prior to visit.      ROS:  Review of Systems  Constitutional: Negative for fever.  Gastrointestinal: Negative for blood in stool, constipation, diarrhea, nausea and vomiting.  Genitourinary: Positive for vaginal bleeding. Negative for dyspareunia, dysuria, flank pain, frequency, hematuria, urgency, vaginal discharge and vaginal pain.  Musculoskeletal: Negative for back pain.  Skin: Negative for rash.    OBJECTIVE:   Vitals:  BP 110/86   Ht 5\' 9"  (1.753 m)   Wt 203 lb (92.1 kg)   BMI 29.98 kg/m   Physical Exam Vitals reviewed.  Constitutional:      Appearance: She is well-developed.  Pulmonary:     Effort: Pulmonary effort is normal.  Genitourinary:    General: Normal vulva.     Vagina: Bleeding present.     Cervix: Normal.  Musculoskeletal:        General: Normal range of motion.     Cervical back: Normal range of motion.  Skin:    General: Skin is warm and dry.  Neurological:     General: No focal deficit present.     Mental Status: She is alert and oriented to person, place, and time.     Cranial Nerves: No cranial nerve deficit.  Psychiatric:        Mood and Affect: Mood normal.        Behavior: Behavior  normal.        Thought Content: Thought content normal.        Judgment: Judgment normal.    Endometrial Biopsy After discussion with the patient  regarding her abnormal uterine bleeding I recommended that she proceed with an endometrial biopsy for further diagnosis. The risks, benefits, alternatives, and indications for an endometrial biopsy were discussed with the patient in detail. She understood the risks including infection, bleeding.  Verbal consent was obtained.   PROCEDURE NOTE:  [place] endometrial biopsy was performed using aseptic technique with iodine preparation.  The uterus was sounded to a length of 7.5 cm.  Adequate sampling was obtained with minimal blood loss.  The patient tolerated the procedure well.  Disposition will be pending pathology.  Assessment/Plan: Abnormal uterine bleeding (AUB) - Plan: Surgical pathology; EMB today. Will f/u with results. Mgmt options depending on results discussed.     Return if symptoms worsen or fail to improve.  Carliss Porcaro B. My Rinke, PA-C 02/22/2020 4:13 PM

## 2020-02-24 LAB — SURGICAL PATHOLOGY

## 2020-02-24 NOTE — Progress Notes (Signed)
Called pt, no answer, LVMTRC. 

## 2020-02-24 NOTE — Progress Notes (Signed)
Pls let pt know EMB negative. AUB due to perimenopause watch and wait or try hormones. Thx

## 2020-02-25 NOTE — Progress Notes (Signed)
Called pt again, no answer, LVMTRC.

## 2020-02-28 NOTE — Progress Notes (Signed)
Called pt, no answer, LVMTRC. 

## 2020-02-28 NOTE — Progress Notes (Signed)
Pt aware.

## 2020-02-28 NOTE — Progress Notes (Signed)
She should take a MVI with iron if not already.

## 2020-03-08 DIAGNOSIS — M9902 Segmental and somatic dysfunction of thoracic region: Secondary | ICD-10-CM | POA: Diagnosis not present

## 2020-03-08 DIAGNOSIS — M5134 Other intervertebral disc degeneration, thoracic region: Secondary | ICD-10-CM | POA: Diagnosis not present

## 2020-03-08 DIAGNOSIS — M9903 Segmental and somatic dysfunction of lumbar region: Secondary | ICD-10-CM | POA: Diagnosis not present

## 2020-03-08 DIAGNOSIS — M62830 Muscle spasm of back: Secondary | ICD-10-CM | POA: Diagnosis not present

## 2020-04-05 DIAGNOSIS — D485 Neoplasm of uncertain behavior of skin: Secondary | ICD-10-CM | POA: Diagnosis not present

## 2020-04-05 DIAGNOSIS — M9902 Segmental and somatic dysfunction of thoracic region: Secondary | ICD-10-CM | POA: Diagnosis not present

## 2020-04-05 DIAGNOSIS — M62830 Muscle spasm of back: Secondary | ICD-10-CM | POA: Diagnosis not present

## 2020-04-05 DIAGNOSIS — D225 Melanocytic nevi of trunk: Secondary | ICD-10-CM | POA: Diagnosis not present

## 2020-04-05 DIAGNOSIS — M9903 Segmental and somatic dysfunction of lumbar region: Secondary | ICD-10-CM | POA: Diagnosis not present

## 2020-04-05 DIAGNOSIS — C44519 Basal cell carcinoma of skin of other part of trunk: Secondary | ICD-10-CM | POA: Diagnosis not present

## 2020-04-05 DIAGNOSIS — D2261 Melanocytic nevi of right upper limb, including shoulder: Secondary | ICD-10-CM | POA: Diagnosis not present

## 2020-04-05 DIAGNOSIS — Z85828 Personal history of other malignant neoplasm of skin: Secondary | ICD-10-CM | POA: Diagnosis not present

## 2020-04-05 DIAGNOSIS — D2262 Melanocytic nevi of left upper limb, including shoulder: Secondary | ICD-10-CM | POA: Diagnosis not present

## 2020-04-05 DIAGNOSIS — M5134 Other intervertebral disc degeneration, thoracic region: Secondary | ICD-10-CM | POA: Diagnosis not present

## 2020-05-03 DIAGNOSIS — M62830 Muscle spasm of back: Secondary | ICD-10-CM | POA: Diagnosis not present

## 2020-05-03 DIAGNOSIS — M9903 Segmental and somatic dysfunction of lumbar region: Secondary | ICD-10-CM | POA: Diagnosis not present

## 2020-05-03 DIAGNOSIS — M9902 Segmental and somatic dysfunction of thoracic region: Secondary | ICD-10-CM | POA: Diagnosis not present

## 2020-05-03 DIAGNOSIS — M5134 Other intervertebral disc degeneration, thoracic region: Secondary | ICD-10-CM | POA: Diagnosis not present

## 2020-05-24 ENCOUNTER — Other Ambulatory Visit: Payer: Self-pay | Admitting: Obstetrics and Gynecology

## 2020-05-24 DIAGNOSIS — M5134 Other intervertebral disc degeneration, thoracic region: Secondary | ICD-10-CM | POA: Diagnosis not present

## 2020-05-24 DIAGNOSIS — M9902 Segmental and somatic dysfunction of thoracic region: Secondary | ICD-10-CM | POA: Diagnosis not present

## 2020-05-24 DIAGNOSIS — Z1231 Encounter for screening mammogram for malignant neoplasm of breast: Secondary | ICD-10-CM

## 2020-05-24 DIAGNOSIS — M62830 Muscle spasm of back: Secondary | ICD-10-CM | POA: Diagnosis not present

## 2020-05-24 DIAGNOSIS — M9903 Segmental and somatic dysfunction of lumbar region: Secondary | ICD-10-CM | POA: Diagnosis not present

## 2020-06-14 DIAGNOSIS — M62830 Muscle spasm of back: Secondary | ICD-10-CM | POA: Diagnosis not present

## 2020-06-14 DIAGNOSIS — M9902 Segmental and somatic dysfunction of thoracic region: Secondary | ICD-10-CM | POA: Diagnosis not present

## 2020-06-14 DIAGNOSIS — M5134 Other intervertebral disc degeneration, thoracic region: Secondary | ICD-10-CM | POA: Diagnosis not present

## 2020-06-14 DIAGNOSIS — M9903 Segmental and somatic dysfunction of lumbar region: Secondary | ICD-10-CM | POA: Diagnosis not present

## 2020-06-15 DIAGNOSIS — C44519 Basal cell carcinoma of skin of other part of trunk: Secondary | ICD-10-CM | POA: Diagnosis not present

## 2020-06-27 DIAGNOSIS — M0880 Other juvenile arthritis, unspecified site: Secondary | ICD-10-CM | POA: Diagnosis not present

## 2020-06-27 DIAGNOSIS — Z6829 Body mass index (BMI) 29.0-29.9, adult: Secondary | ICD-10-CM | POA: Diagnosis not present

## 2020-06-27 DIAGNOSIS — Z79899 Other long term (current) drug therapy: Secondary | ICD-10-CM | POA: Diagnosis not present

## 2020-06-28 ENCOUNTER — Telehealth: Payer: Self-pay

## 2020-06-28 NOTE — Telephone Encounter (Signed)
Called pt, no answer, voice mail cannot accept msgs.

## 2020-06-28 NOTE — Telephone Encounter (Signed)
Patient is requesting a call back. Please try both numbers on file. Thank you

## 2020-06-28 NOTE — Telephone Encounter (Signed)
Pt left msg on triage saying she is having bad hot flashes and night sweats. Black cohosh was working when she first started it, not anymore. Please advise. Due for annual as of 2/22, hasnt scheduled.

## 2020-06-28 NOTE — Telephone Encounter (Signed)
Pt can try estroven over-the-counter. Can discuss more at annual so we can better eval cycles, too.

## 2020-06-29 NOTE — Telephone Encounter (Signed)
Pt aware. Pls call pt to schedule annual.

## 2020-06-29 NOTE — Telephone Encounter (Signed)
Voicemail is full unable to leave message

## 2020-07-04 ENCOUNTER — Other Ambulatory Visit: Payer: Self-pay | Admitting: Cardiovascular Disease

## 2020-07-12 DIAGNOSIS — M62830 Muscle spasm of back: Secondary | ICD-10-CM | POA: Diagnosis not present

## 2020-07-12 DIAGNOSIS — M9903 Segmental and somatic dysfunction of lumbar region: Secondary | ICD-10-CM | POA: Diagnosis not present

## 2020-07-12 DIAGNOSIS — M5134 Other intervertebral disc degeneration, thoracic region: Secondary | ICD-10-CM | POA: Diagnosis not present

## 2020-07-12 DIAGNOSIS — M9902 Segmental and somatic dysfunction of thoracic region: Secondary | ICD-10-CM | POA: Diagnosis not present

## 2020-07-17 NOTE — Progress Notes (Signed)
Cardiology Office Note  Date:  07/18/2020   ID:  Nicole, Zuniga 05/17/1975, MRN [id number]  PCP:  [name], MD   Chief Complaint  Patient presents with  . 12 month follow up     "doing well." Medications reviewed by the patient verbally.     HPI:  45 y.o. female w/ a h/o  RA, on enbrel anxiety, panic attacks,  admitted 1/16 with chest pain and NSTEMI -  trop peaked @ 4.38.  suggestive but not conclusive of spontaneous coronary artery dissection. Echo 1/17 showed nl EF w/o wma's. Presents for follow-up after recent hospitalization for non-STEMI, coronary artery disease  LOV February 2021  In follow-up today reports she is doing well, denies any chest pain concerning for angina Busy at work, no regular exercise program but stays active Recently helped assist with a bank merger  Continues to have occasional palpitations sometimes daytime, other times nighttime Do not seem to persist, in general are short-lived  No significant leg edema, no abdominal distention, CHF symptoms  Lab work from 2020 Total chol 118, LDL 62 HBA1C 5.5   EKG personally reviewed by myself on todays visit Shows normal sinus rhythm rate 65 bpm nonspecific ST-T wave abnormality  Other past medical history reviewed  Cardiac catheterization performed 05/02/2017  Mid Cx lesion is 50% stenosed.  The left ventricular systolic function is normal.  LV end diastolic pressure is mildly elevated.  The left ventricular ejection fraction is 55-65% by visual estimate.   1.  Moderate one-vessel coronary artery disease with 50% stenosis in the mid left circumflex that has an appearance that is suggestive but not conclusive of spontaneous coronary artery dissection. 2.  Normal LV systolic function and mildly elevated left ventricular end-diastolic pressure.   PMH:   has a past medical history of Allergic rhinitis, Anxiety, Basal cell carcinoma (2006), Essential hypertension, Non-ST elevation  (NSTEMI) myocardial infarction ([place]) (04/2017), Panic attack, and RA (rheumatoid arthritis) ([place]).  PSH:    Past Surgical History:  Procedure Laterality Date  . HERNIA REPAIR  07/2010   LIH  . KNEE SURGERY Left   . LEFT HEART CATH AND CORONARY ANGIOGRAPHY N/A 05/02/2017   Procedure: LEFT HEART CATH AND CORONARY ANGIOGRAPHY;  Surgeon: Wellington Hampshire, MD;  Location: [facility];  Service: Cardiovascular;  Laterality: N/A;  . NECK SURGERY     Excision of lymph node    Current Outpatient Medications  Medication Sig Dispense Refill  . ALPRAZolam (XANAX) 0.25 MG tablet Take 0.25 mg by mouth at bedtime as needed for anxiety.   5  . ASPIRIN LOW DOSE 81 MG EC tablet TAKE 1 TABLET BY MOUTH EVERY DAY 30 tablet 0  . bisoprolol (ZEBETA) 5 MG tablet Take 5 mg by mouth daily.  0  . Black Cohosh 40 MG CAPS Take 1 capsule by mouth daily.    . Cholecalciferol (VITAMIN D) 2000 units tablet Take 2,000 Units by mouth daily.    Scarlette Shorts SURECLICK 50 MG/ML injection Inject 50 mg into the skin once a week. Takes on the weekend  2  . fluticasone (FLONASE) 50 MCG/ACT nasal spray Place into the nose.    . levocetirizine (XYZAL) 5 MG tablet Take 5 mg by mouth daily.    Marland Kitchen losartan (COZAAR) 25 MG tablet TAKE 1 TABLET BY MOUTH DAILY 90 tablet 3  . Multiple Vitamin (MULTI-VITAMINS) TABS Take 1 tablet by mouth daily.    . Rhubarb (ESTROVEN COMPLETE PO) Take by mouth.    Marland Kitchen  rosuvastatin (CRESTOR) 5 MG tablet TAKE 1 TABLET(5 MG) BY MOUTH DAILY 30 tablet 11   No current facility-administered medications for this visit.    Allergies:   Lisinopril   Social History:  The patient  reports that she has never smoked. She has never used smokeless tobacco. She reports current alcohol use. She reports that she does not use drugs.   Family History:   family history includes CAD in her father; Congestive Heart Failure in her father; Diabetes in her mother and sister; Hyperlipidemia in her father and mother;  Hypertension in her father and mother; Leukemia in her paternal grandfather.    Review of Systems: Review of Systems  Constitutional: Negative.   HENT: Negative.   Respiratory: Negative.   Cardiovascular: Negative.   Gastrointestinal: Negative.   Musculoskeletal: Negative.   Neurological: Negative.   Psychiatric/Behavioral: Negative.   All other systems reviewed and are negative.   PHYSICAL EXAM: VS:  BP (!) 142/94 (BP Location: Left Arm, Patient Position: Sitting, Cuff Size: Normal)   Pulse 65   Ht 5\' 9"  (1.753 m)   Wt 206 lb 4 oz (93.6 kg)   SpO2 98%   BMI 30.46 kg/m  , BMI Body mass index is 30.46 kg/m. Constitutional:  oriented to person, place, and time. No distress.  HENT:  Head: Grossly normal Eyes:  no discharge. No scleral icterus.  Neck: No JVD, no carotid bruits  Cardiovascular: Regular rate and rhythm, no murmurs appreciated Pulmonary/Chest: Clear to auscultation bilaterally, no wheezes or rails Abdominal: Soft.  no distension.  no tenderness.  Musculoskeletal: Normal range of motion Neurological:  normal muscle tone. Coordination normal. No atrophy Skin: Skin warm and dry Psychiatric: normal affect, pleasant  Recent Labs: 02/04/2020: TSH 0.892    Lipid Panel No results found for: CHOL, HDL, LDLCALC, TRIG    Wt Readings from Last 3 Encounters:  07/18/20 206 lb 4 oz (93.6 kg)  02/22/20 203 lb (92.1 kg)  02/02/20 206 lb (93.4 kg)      ASSESSMENT AND PLAN:  NSTEMI (non-ST elevated myocardial infarction) ([place])  Felt to be secondary to coronary dissection mid left circumflex nonocclusive Continue aspirin, Crestor, beta-blocker, losartan No changes to medications  Essential hypertension - Plan: EKG 12-Lead Recommend she monitor blood pressure at home, typically well controlled If blood pressure does run high could increase bisoprolol and/or losartan  Rheumatoid arthritis, involving unspecified site, unspecified rheumatoid factor presence  ([place]) Managed by primary care and rheumatology Denies any recent flareups    Total encounter time more than 25 minutes  Greater than 50% was spent in counseling and coordination of care with the patient   Orders Placed This Encounter  Procedures  . EKG 12-Lead     Signed, Esmond Plants, M.D., Ph.D. 07/18/2020  [place], [place]

## 2020-07-18 ENCOUNTER — Other Ambulatory Visit: Payer: Self-pay | Admitting: Cardiovascular Disease

## 2020-07-18 ENCOUNTER — Encounter: Payer: Self-pay | Admitting: Cardiovascular Disease

## 2020-07-18 ENCOUNTER — Ambulatory Visit (INDEPENDENT_AMBULATORY_CARE_PROVIDER_SITE_OTHER): Payer: BC Managed Care – PPO | Admitting: Cardiovascular Disease

## 2020-07-18 ENCOUNTER — Other Ambulatory Visit: Payer: Self-pay

## 2020-07-18 VITALS — BP 142/94 | HR 65 | Ht 69.0 in | Wt 206.2 lb

## 2020-07-18 DIAGNOSIS — I25118 Atherosclerotic heart disease of native coronary artery with other forms of angina pectoris: Secondary | ICD-10-CM | POA: Diagnosis not present

## 2020-07-18 DIAGNOSIS — I10 Essential (primary) hypertension: Secondary | ICD-10-CM

## 2020-07-18 NOTE — Patient Instructions (Signed)
Medication Instructions:  No changes  If you need a refill on your cardiac medications before your next appointment, please call your pharmacy.    Lab work: No new labs needed   If you have labs (blood work) drawn today and your tests are completely normal, you will receive your results only by: . MyChart Message (if you have MyChart) OR . A paper copy in the mail If you have any lab test that is abnormal or we need to change your treatment, we will call you to review the results.   Testing/Procedures: No new testing needed   Follow-Up: At [facility], you and your health needs are our priority.  As part of our continuing mission to provide you with exceptional heart care, we have created designated Provider Care Teams.  These Care Teams include your primary Cardiologist (physician) and Advanced Practice Providers (APPs -  Physician Assistants and Nurse Practitioners) who all work together to provide you with the care you need, when you need it.  . You will need a follow up appointment in 12 months  . Providers on your designated Care Team:   . Christopher Berge, NP . Ryan Dunn, PA-C . Jacquelyn Visser, PA-C  Any Other Special Instructions Will Be Listed Below (If Applicable).  COVID-19 Vaccine Information can be found at: [email or web address] For questions related to vaccine distribution or appointments, please email [email or web address] or call [phone].     

## 2020-07-20 ENCOUNTER — Other Ambulatory Visit: Payer: Self-pay

## 2020-07-20 ENCOUNTER — Ambulatory Visit
Admission: RE | Admit: 2020-07-20 | Discharge: 2020-07-20 | Disposition: A | Discharge status: Home / Self Care | Payer: BC Managed Care – PPO | Source: Ambulatory Visit | Attending: Obstetrics and Gynecology | Admitting: Obstetrics and Gynecology

## 2020-07-20 DIAGNOSIS — Z1231 Encounter for screening mammogram for malignant neoplasm of breast: Secondary | ICD-10-CM | POA: Diagnosis not present

## 2020-07-24 ENCOUNTER — Encounter: Payer: Self-pay | Admitting: Obstetrics and Gynecology

## 2020-07-31 DIAGNOSIS — I6523 Occlusion and stenosis of bilateral carotid arteries: Secondary | ICD-10-CM | POA: Diagnosis not present

## 2020-07-31 DIAGNOSIS — Z13220 Encounter for screening for lipoid disorders: Secondary | ICD-10-CM | POA: Diagnosis not present

## 2020-07-31 DIAGNOSIS — E559 Vitamin D deficiency, unspecified: Secondary | ICD-10-CM | POA: Diagnosis not present

## 2020-07-31 DIAGNOSIS — Z1389 Encounter for screening for other disorder: Secondary | ICD-10-CM | POA: Diagnosis not present

## 2020-07-31 DIAGNOSIS — Z0000 Encounter for general adult medical examination without abnormal findings: Secondary | ICD-10-CM | POA: Diagnosis not present

## 2020-07-31 DIAGNOSIS — Z131 Encounter for screening for diabetes mellitus: Secondary | ICD-10-CM | POA: Diagnosis not present

## 2020-07-31 DIAGNOSIS — M0880 Other juvenile arthritis, unspecified site: Secondary | ICD-10-CM | POA: Diagnosis not present

## 2020-08-02 DIAGNOSIS — M5134 Other intervertebral disc degeneration, thoracic region: Secondary | ICD-10-CM | POA: Diagnosis not present

## 2020-08-02 DIAGNOSIS — M9903 Segmental and somatic dysfunction of lumbar region: Secondary | ICD-10-CM | POA: Diagnosis not present

## 2020-08-02 DIAGNOSIS — M62830 Muscle spasm of back: Secondary | ICD-10-CM | POA: Diagnosis not present

## 2020-08-02 DIAGNOSIS — M9902 Segmental and somatic dysfunction of thoracic region: Secondary | ICD-10-CM | POA: Diagnosis not present

## 2020-08-08 DIAGNOSIS — I6523 Occlusion and stenosis of bilateral carotid arteries: Secondary | ICD-10-CM | POA: Diagnosis not present

## 2020-08-08 DIAGNOSIS — I10 Essential (primary) hypertension: Secondary | ICD-10-CM | POA: Diagnosis not present

## 2020-08-08 DIAGNOSIS — I2510 Atherosclerotic heart disease of native coronary artery without angina pectoris: Secondary | ICD-10-CM | POA: Diagnosis not present

## 2020-08-08 DIAGNOSIS — E041 Nontoxic single thyroid nodule: Secondary | ICD-10-CM | POA: Diagnosis not present

## 2020-08-22 ENCOUNTER — Other Ambulatory Visit: Payer: Self-pay | Admitting: Cardiovascular Disease

## 2020-08-23 DIAGNOSIS — M62830 Muscle spasm of back: Secondary | ICD-10-CM | POA: Diagnosis not present

## 2020-08-23 DIAGNOSIS — M5134 Other intervertebral disc degeneration, thoracic region: Secondary | ICD-10-CM | POA: Diagnosis not present

## 2020-08-23 DIAGNOSIS — M9903 Segmental and somatic dysfunction of lumbar region: Secondary | ICD-10-CM | POA: Diagnosis not present

## 2020-08-23 DIAGNOSIS — M9902 Segmental and somatic dysfunction of thoracic region: Secondary | ICD-10-CM | POA: Diagnosis not present

## 2020-08-31 ENCOUNTER — Ambulatory Visit (INDEPENDENT_AMBULATORY_CARE_PROVIDER_SITE_OTHER): Payer: BC Managed Care – PPO | Admitting: Obstetrics and Gynecology

## 2020-08-31 ENCOUNTER — Encounter: Payer: Self-pay | Admitting: Obstetrics and Gynecology

## 2020-08-31 ENCOUNTER — Other Ambulatory Visit: Payer: Self-pay

## 2020-08-31 VITALS — BP 130/90 | Ht 69.0 in | Wt 207.0 lb

## 2020-08-31 DIAGNOSIS — Z01419 Encounter for gynecological examination (general) (routine) without abnormal findings: Secondary | ICD-10-CM

## 2020-08-31 DIAGNOSIS — Z1211 Encounter for screening for malignant neoplasm of colon: Secondary | ICD-10-CM | POA: Diagnosis not present

## 2020-08-31 DIAGNOSIS — Z1231 Encounter for screening mammogram for malignant neoplasm of breast: Secondary | ICD-10-CM

## 2020-08-31 DIAGNOSIS — N951 Menopausal and female climacteric states: Secondary | ICD-10-CM | POA: Diagnosis not present

## 2020-08-31 NOTE — Patient Instructions (Signed)
I value your feedback and you entrusting us with your care. If you get a [facility] patient survey, I would appreciate you taking the time to let us know about your experience today. Thank you! ? ? ?

## 2020-08-31 NOTE — Progress Notes (Signed)
PCP: [name], MD   Chief Complaint  Patient presents with  . Gynecologic Exam    No concerns    HPI:      [name] is a 45 y.o. [id number] whose LMP was Patient's last menstrual period was 07/18/2020 (exact date)., presents today for her annual examination.  Her menses are irregular due to perimenopause. LMP lasted 2 wks, light to mod flow, no dysmen. PMP was 6 months prior. Started spotting today.  Had AUB with menses Q2 wks to 2 months 11/21 with neg GYN u/s and neg EMB 11/21 Hx of irreg menses off and on since 2020. She does have vasomotor sx, controlled with black cohosh in past but no longer working. Changed to estroven with sx relief.   Hx of HTN, mild non STEMI 1/19.  Sex activity: single partner, contraception - vasectomy. She does not have vaginal dryness.  Last Pap: 06/04/18  Results were: no abnormalities /neg HPV DNA 2018.   Last mammogram: 07/20/20  Results were: normal--routine follow-up in 12 months There is no FH of breast cancer. There is no FH of ovarian cancer. The patient does do self-breast exams.  Colonoscopy: never; PCP said to hold off for now.   Tobacco use: The patient denies current or previous tobacco use. Alcohol use: none Exercise: very active  She does get adequate calcium and Vitamin D in her diet.  Labs with PCP.   Past Medical History:  Diagnosis Date  . Allergic rhinitis   . Anxiety   . Basal cell carcinoma 2006   left superior lateral chest at breast  . Essential hypertension   . Non-ST elevation (NSTEMI) myocardial infarction ([place]) 04/2017  . Panic attack   . RA (rheumatoid arthritis) ([place])     Past Surgical History:  Procedure Laterality Date  . HERNIA REPAIR  07/2010   [facility]  . KNEE SURGERY Left   . LEFT HEART CATH AND CORONARY ANGIOGRAPHY N/A 05/02/2017   Procedure: LEFT HEART CATH AND CORONARY ANGIOGRAPHY;  Surgeon: Wellington Hampshire, MD;  Location: [facility];  Service: Cardiovascular;  Laterality:  N/A;  . NECK SURGERY     Excision of lymph node    Family History  Problem Relation Age of Onset  . Hypertension Mother   . Hyperlipidemia Mother   . Diabetes Mother   . CAD Father        s/p CABG in his late 11's  . Hypertension Father   . Hyperlipidemia Father   . Congestive Heart Failure Father   . Diabetes Sister   . Leukemia Paternal Grandfather     Social History   Socioeconomic History  . Marital status: Married    Spouse name: Nicole Zuniga  . Number of children: 3  . Years of education: Not on file  . Highest education level: Not on file  Occupational History  . Occupation: Landscape architect  Tobacco Use  . Smoking status: Never Smoker  . Smokeless tobacco: Never Used  Vaping Use  . Vaping Use: Never used  Substance and Sexual Activity  . Alcohol use: Yes    Comment: rare  . Drug use: No  . Sexual activity: Yes    Partners: Male    Birth control/protection: Other-see comments    Comment: Vasectomy  Other Topics Concern  . Not on file  Social History Narrative   Lives locally with her husband and three children (ages 56, 46, 4).  Works as Landscape architect.  Exercises regularly.  Social Determinants of Health   Financial Resource Strain: Not on file  Food Insecurity: Not on file  Transportation Needs: Not on file  Physical Activity: Not on file  Stress: Not on file  Social Connections: Not on file  Intimate Partner Violence: Not on file     Current Outpatient Medications:  .  ALPRAZolam (XANAX) 0.25 MG tablet, Take 0.25 mg by mouth at bedtime as needed for anxiety. , Disp: , Rfl: 5 .  ASPIRIN LOW DOSE 81 MG EC tablet, TAKE 1 TABLET BY MOUTH EVERY DAY, Disp: 30 tablet, Rfl: 0 .  bisoprolol (ZEBETA) 5 MG tablet, Take 5 mg by mouth daily., Disp: , Rfl: 0 .  Cholecalciferol (VITAMIN D) 2000 units tablet, Take 2,000 Units by mouth daily., Disp: , Rfl:  .  ENBREL SURECLICK 50 MG/ML injection, Inject 50 mg into the skin once a week. Takes on the weekend, Disp: ,  Rfl: 2 .  fluticasone (FLONASE) 50 MCG/ACT nasal spray, Place into the nose., Disp: , Rfl:  .  levocetirizine (XYZAL) 5 MG tablet, Take 5 mg by mouth daily., Disp: , Rfl:  .  losartan (COZAAR) 25 MG tablet, TAKE 1 TABLET BY MOUTH DAILY, Disp: 90 tablet, Rfl: 3 .  Multiple Vitamin (MULTI-VITAMINS) TABS, Take 1 tablet by mouth daily., Disp: , Rfl:  .  Rhubarb (ESTROVEN COMPLETE PO), Take by mouth., Disp: , Rfl:  .  rosuvastatin (CRESTOR) 5 MG tablet, TAKE 1 TABLET(5 MG) BY MOUTH DAILY, Disp: 30 tablet, Rfl: 11     ROS:  Review of Systems  Constitutional: Negative for fatigue, fever and unexpected weight change.  Respiratory: Negative for cough, shortness of breath and wheezing.   Cardiovascular: Negative for chest pain, palpitations and leg swelling.  Gastrointestinal: Negative for blood in stool, constipation, diarrhea, nausea and vomiting.  Endocrine: Negative for cold intolerance, heat intolerance and polyuria.  Genitourinary: Negative for dyspareunia, dysuria, flank pain, frequency, genital sores, hematuria, menstrual problem, pelvic pain, urgency, vaginal bleeding, vaginal discharge and vaginal pain.  Musculoskeletal: Negative for back pain, joint swelling and myalgias.  Skin: Negative for rash.  Neurological: Negative for dizziness, syncope, light-headedness, numbness and headaches.  Hematological: Negative for adenopathy.  Psychiatric/Behavioral: Positive for agitation. Negative for confusion, sleep disturbance and suicidal ideas. The patient is not nervous/anxious.   BREAST: No symptoms    Objective: BP 130/90   Ht 5\' 9"  (1.753 m)   Wt 207 lb (93.9 kg)   LMP 07/18/2020 (Exact Date)   BMI 30.57 kg/m    Physical Exam Constitutional:      Appearance: She is well-developed.  Genitourinary:     Vulva normal.     Right Labia: No rash, tenderness or lesions.    Left Labia: No tenderness, lesions or rash.    No vaginal discharge, erythema or tenderness.      Right Adnexa:  not tender and no mass present.    Left Adnexa: not tender and no mass present.    No cervical friability or polyp.     Uterus is not enlarged or tender.  Breasts:     Right: No mass, nipple discharge, skin change or tenderness.     Left: No mass, nipple discharge, skin change or tenderness.    Neck:     Thyroid: No thyromegaly.  Cardiovascular:     Rate and Rhythm: Normal rate and regular rhythm.     Heart sounds: Normal heart sounds. No murmur heard.   Pulmonary:     Effort: Pulmonary effort  is normal.     Breath sounds: Normal breath sounds.  Abdominal:     Palpations: Abdomen is soft.     Tenderness: There is no abdominal tenderness. There is no guarding or rebound.  Musculoskeletal:        General: Normal range of motion.     Cervical back: Normal range of motion.  Lymphadenopathy:     Cervical: No cervical adenopathy.  Neurological:     General: No focal deficit present.     Mental Status: She is alert and oriented to person, place, and time.     Cranial Nerves: No cranial nerve deficit.  Skin:    General: Skin is warm and dry.  Psychiatric:        Mood and Affect: Mood normal.        Behavior: Behavior normal.        Thought Content: Thought content normal.        Judgment: Judgment normal.  Vitals reviewed.     Assessment/Plan:  Encounter for annual routine gynecological examination  Encounter for screening mammogram for malignant neoplasm of breast; pt current on mammo  Perimenopause--f/u prn AUB. Neg EMB 11/21. Discussed IUD/prog only options prn. Pt to follow for now.  Screening for colon cancer--colonoscopy discussed. PCP suggested pt hold off for now. F/u prn.           GYN counsel breast self exam, mammography screening, menopause, adequate intake of calcium and vitamin D, diet and exercise    F/U  Return in about 1 year (around 08/31/2021).  Halton Neas B. Quenna Doepke, PA-C 08/31/2020 3:54 PM

## 2020-09-13 DIAGNOSIS — M9903 Segmental and somatic dysfunction of lumbar region: Secondary | ICD-10-CM | POA: Diagnosis not present

## 2020-09-13 DIAGNOSIS — M9902 Segmental and somatic dysfunction of thoracic region: Secondary | ICD-10-CM | POA: Diagnosis not present

## 2020-09-13 DIAGNOSIS — M62830 Muscle spasm of back: Secondary | ICD-10-CM | POA: Diagnosis not present

## 2020-09-13 DIAGNOSIS — M5134 Other intervertebral disc degeneration, thoracic region: Secondary | ICD-10-CM | POA: Diagnosis not present

## 2020-09-29 ENCOUNTER — Other Ambulatory Visit: Payer: Self-pay | Admitting: Cardiovascular Disease

## 2020-10-11 DIAGNOSIS — M9902 Segmental and somatic dysfunction of thoracic region: Secondary | ICD-10-CM | POA: Diagnosis not present

## 2020-10-11 DIAGNOSIS — M62830 Muscle spasm of back: Secondary | ICD-10-CM | POA: Diagnosis not present

## 2020-10-11 DIAGNOSIS — M9903 Segmental and somatic dysfunction of lumbar region: Secondary | ICD-10-CM | POA: Diagnosis not present

## 2020-10-11 DIAGNOSIS — M5134 Other intervertebral disc degeneration, thoracic region: Secondary | ICD-10-CM | POA: Diagnosis not present

## 2020-10-12 DIAGNOSIS — D2261 Melanocytic nevi of right upper limb, including shoulder: Secondary | ICD-10-CM | POA: Diagnosis not present

## 2020-10-12 DIAGNOSIS — L570 Actinic keratosis: Secondary | ICD-10-CM | POA: Diagnosis not present

## 2020-10-12 DIAGNOSIS — D225 Melanocytic nevi of trunk: Secondary | ICD-10-CM | POA: Diagnosis not present

## 2020-10-12 DIAGNOSIS — D485 Neoplasm of uncertain behavior of skin: Secondary | ICD-10-CM | POA: Diagnosis not present

## 2020-10-12 DIAGNOSIS — X32XXXA Exposure to sunlight, initial encounter: Secondary | ICD-10-CM | POA: Diagnosis not present

## 2020-10-12 DIAGNOSIS — D2271 Melanocytic nevi of right lower limb, including hip: Secondary | ICD-10-CM | POA: Diagnosis not present

## 2020-10-12 DIAGNOSIS — D2262 Melanocytic nevi of left upper limb, including shoulder: Secondary | ICD-10-CM | POA: Diagnosis not present

## 2020-10-12 DIAGNOSIS — C44519 Basal cell carcinoma of skin of other part of trunk: Secondary | ICD-10-CM | POA: Diagnosis not present

## 2020-11-01 DIAGNOSIS — M9903 Segmental and somatic dysfunction of lumbar region: Secondary | ICD-10-CM | POA: Diagnosis not present

## 2020-11-01 DIAGNOSIS — M62830 Muscle spasm of back: Secondary | ICD-10-CM | POA: Diagnosis not present

## 2020-11-01 DIAGNOSIS — M5134 Other intervertebral disc degeneration, thoracic region: Secondary | ICD-10-CM | POA: Diagnosis not present

## 2020-11-01 DIAGNOSIS — M9902 Segmental and somatic dysfunction of thoracic region: Secondary | ICD-10-CM | POA: Diagnosis not present

## 2020-11-02 DIAGNOSIS — C44519 Basal cell carcinoma of skin of other part of trunk: Secondary | ICD-10-CM | POA: Diagnosis not present

## 2020-11-16 ENCOUNTER — Encounter: Payer: Self-pay | Admitting: Dermatology

## 2020-11-16 ENCOUNTER — Ambulatory Visit (INDEPENDENT_AMBULATORY_CARE_PROVIDER_SITE_OTHER): Payer: Self-pay | Admitting: Dermatology

## 2020-11-16 ENCOUNTER — Other Ambulatory Visit: Payer: Self-pay

## 2020-11-16 DIAGNOSIS — I781 Nevus, non-neoplastic: Secondary | ICD-10-CM

## 2020-11-16 NOTE — Progress Notes (Signed)
   New Patient Visit  Subjective  Nicole Zuniga is a 45 y.o. female who presents for the following: veins  (Of the lower legs - patient would like to discuss treatment options. She does have pain in the legs as well but has a history of arthritis and she thinks that may be related.).  The following portions of the chart were reviewed this encounter and updated as appropriate:   Tobacco  Allergies  Meds  Problems  Med Hx  Surg Hx  Fam Hx     Review of Systems:  No other skin or systemic complaints except as noted in HPI or Assessment and Plan.  Objective  Well appearing patient in no apparent distress; mood and affect are within normal limits.  A focused examination was performed including the B/L leg. Relevant physical exam findings are noted in the Assessment and Plan.  B/L leg Blue veins of the legs.                   Assessment & Plan  Spider veins B/L leg No obvious large varicose vein abnormality visible today -   Discussed sclerotherapy for spider vein treatment.  Discussed that it is a cosmetic procedure and is not covered by insurance ($350/treatment).  Treatment consists of injecting the spider veins with a medicine called Asclera to help them disappear.  Multiple treatments are generally necessary to get best results.  Risks including bruising and persistent discoloration due to post-inflammatory hyperpigmentation.  Sclerotherapy does not prevent the development of new spider veins.  Daily compression hose for two weeks after procedure is recommended.  Advised patient that she will likely need more than one session to treat all veins.  Pamphlet given today for Asclera.   Return for sclerotherapy .  Luther Redo, CMA, am acting as scribe for Sarina Ser, MD . Documentation: I have reviewed the above documentation for accuracy and completeness, and I agree with the above.  Sarina Ser, MD

## 2020-11-16 NOTE — Patient Instructions (Signed)

## 2020-11-22 DIAGNOSIS — M9902 Segmental and somatic dysfunction of thoracic region: Secondary | ICD-10-CM | POA: Diagnosis not present

## 2020-11-22 DIAGNOSIS — M62830 Muscle spasm of back: Secondary | ICD-10-CM | POA: Diagnosis not present

## 2020-11-22 DIAGNOSIS — M9903 Segmental and somatic dysfunction of lumbar region: Secondary | ICD-10-CM | POA: Diagnosis not present

## 2020-11-22 DIAGNOSIS — M5134 Other intervertebral disc degeneration, thoracic region: Secondary | ICD-10-CM | POA: Diagnosis not present

## 2020-12-08 IMAGING — MG DIGITAL SCREENING BILAT W/ TOMO W/ CAD
8 series · 8 of 24 positions shown · non-contrast
Comparison: Previous exam(s).

CLINICAL DATA: Screening.

EXAM:
DIGITAL SCREENING BILATERAL MAMMOGRAM WITH TOMO AND CAD

[L CC synth-2D]
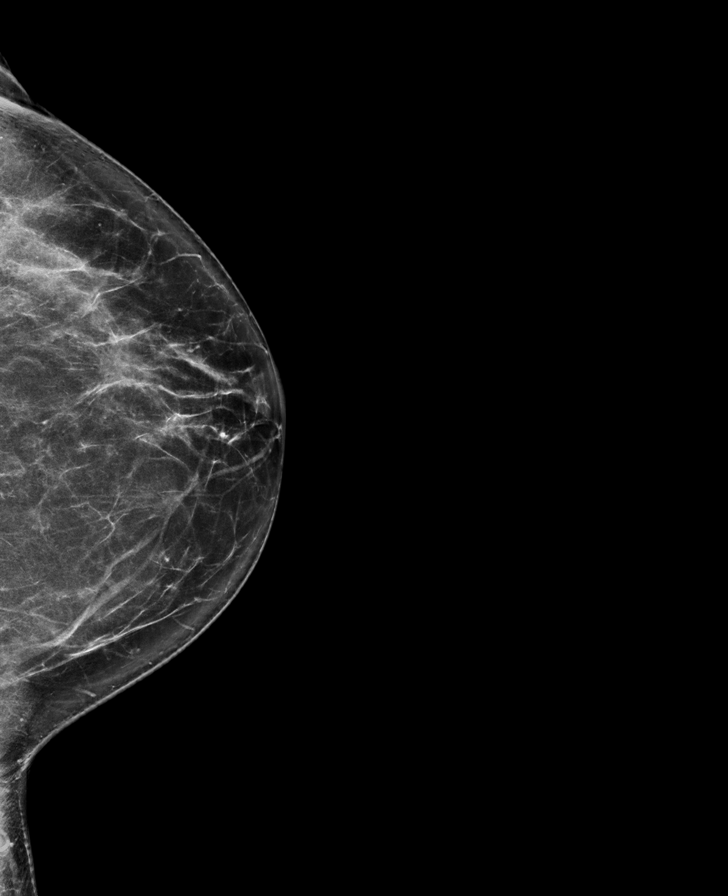

[R CC synth-2D]
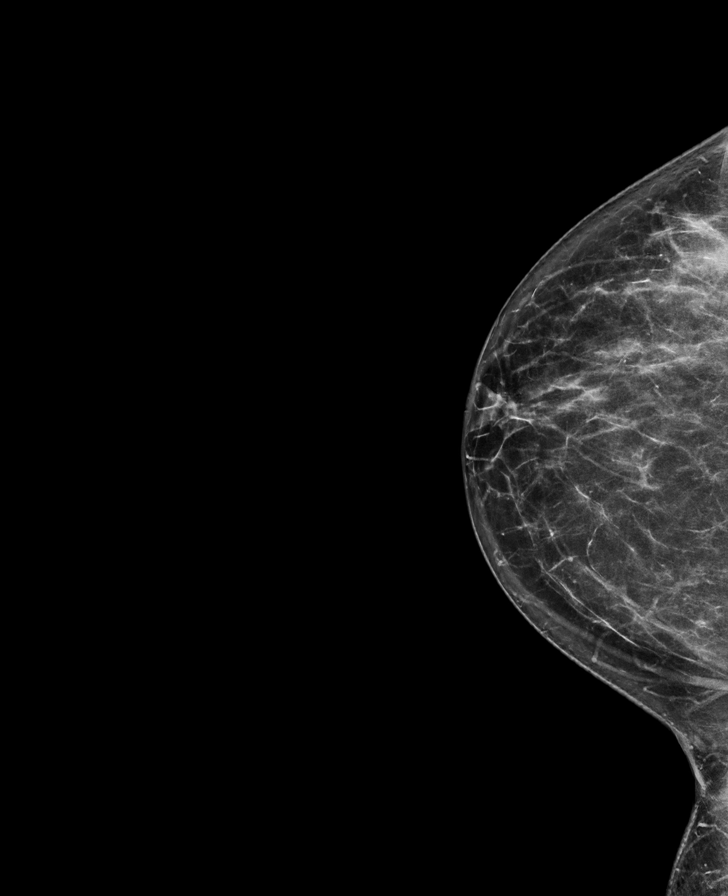

[L MLO synth-2D]
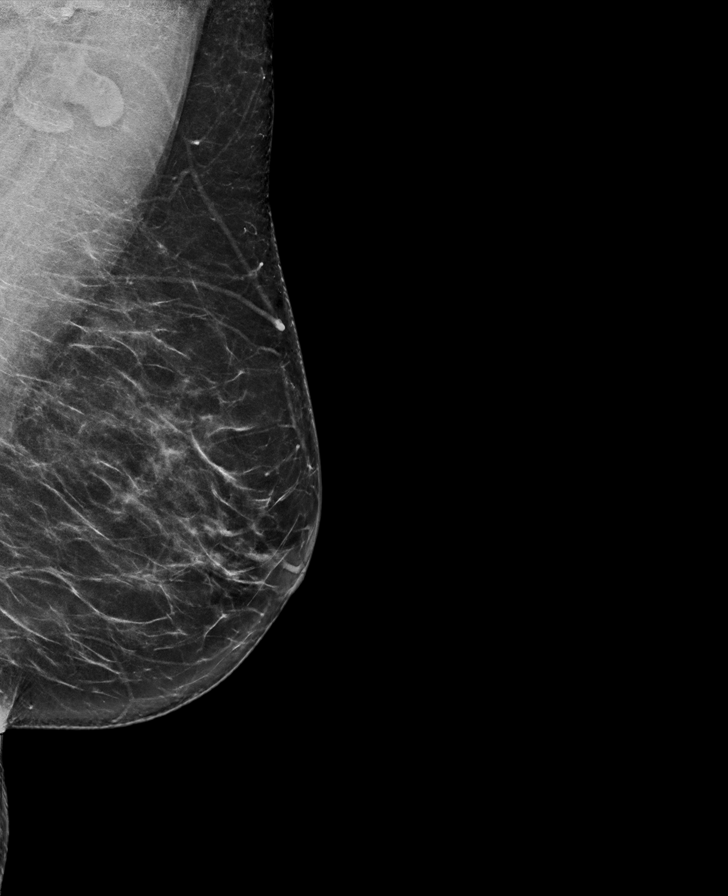

[R MLO synth-2D]
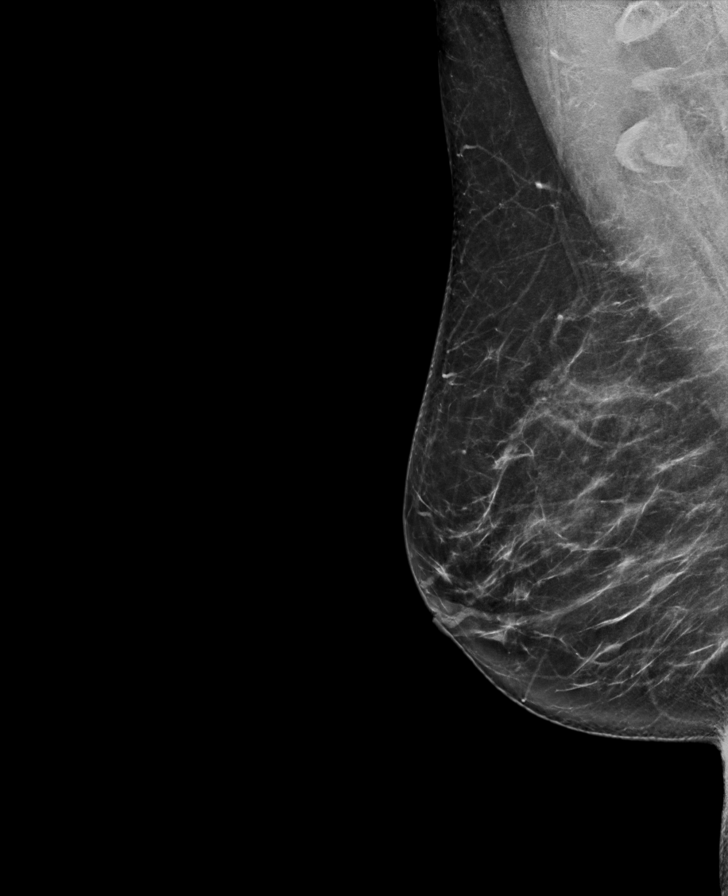

[L MLO tomo · tomo slice 39/78.0]
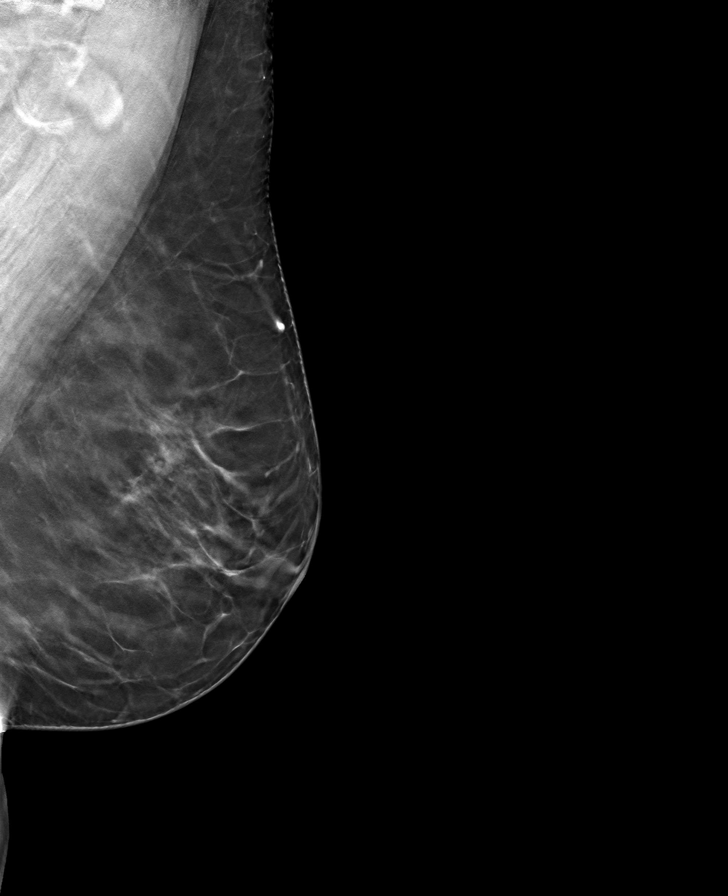

[L CC tomo · tomo slice 39/77.0]
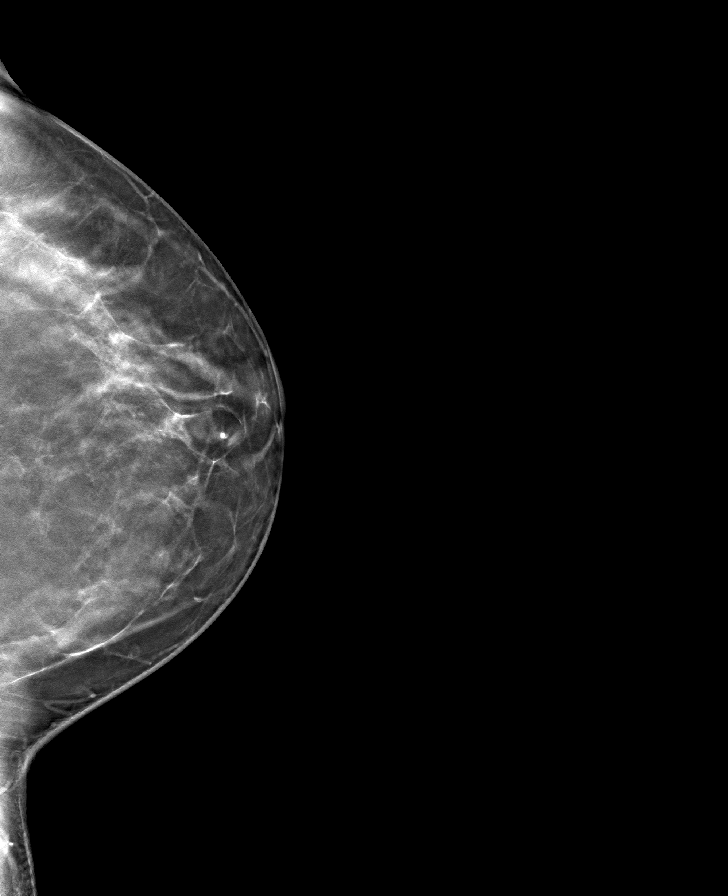

[R MLO tomo · tomo slice 39/78.0]
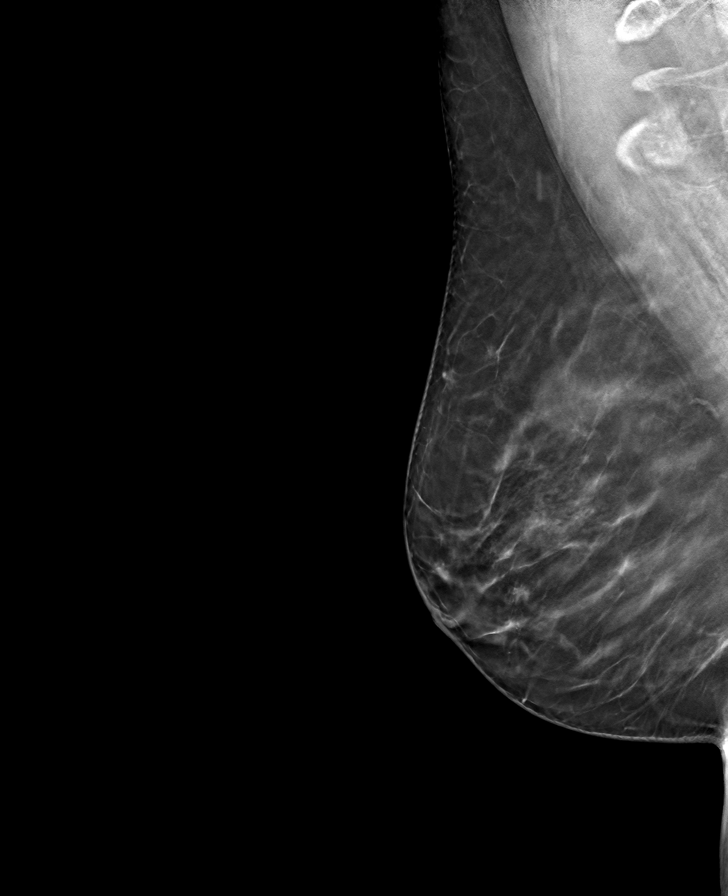

[R CC tomo · tomo slice 35/70.0]
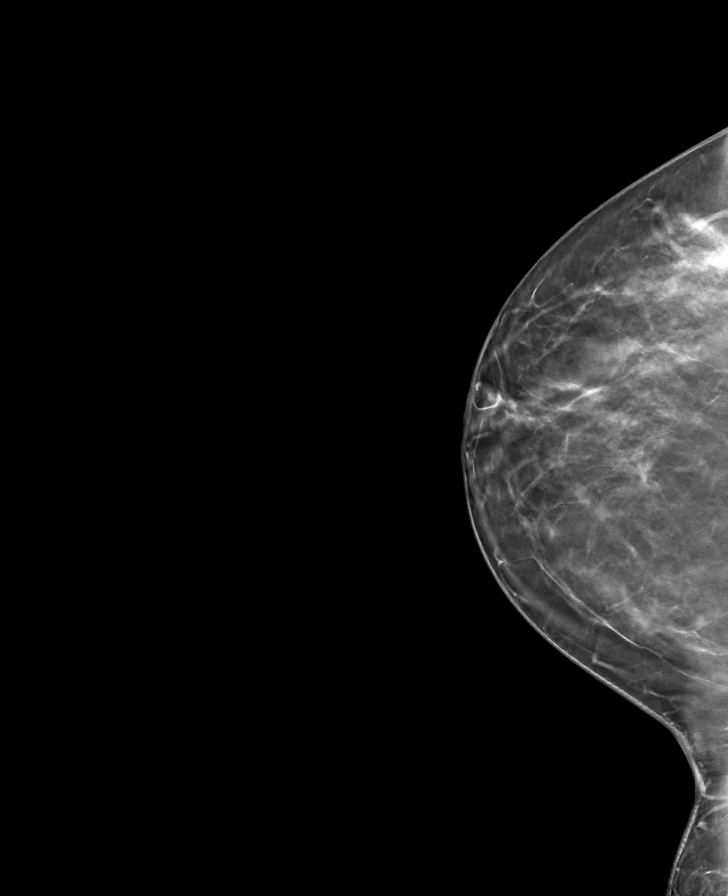

[8 of 24 positions shown; findings below may reference images not displayed]

ACR Breast Density Category b: There are scattered areas of
fibroglandular density.
FINDINGS: There are no findings suspicious for malignancy. Images were
processed with CAD.
IMPRESSION: No mammographic evidence of malignancy. A result letter of this
screening mammogram will be mailed directly to the patient.

RECOMMENDATION:
Screening mammogram in one year. (Code:[id number])

BI-RADS CATEGORY  1: Negative.

## 2020-12-13 DIAGNOSIS — M5134 Other intervertebral disc degeneration, thoracic region: Secondary | ICD-10-CM | POA: Diagnosis not present

## 2020-12-13 DIAGNOSIS — M9903 Segmental and somatic dysfunction of lumbar region: Secondary | ICD-10-CM | POA: Diagnosis not present

## 2020-12-13 DIAGNOSIS — M62830 Muscle spasm of back: Secondary | ICD-10-CM | POA: Diagnosis not present

## 2020-12-13 DIAGNOSIS — M9902 Segmental and somatic dysfunction of thoracic region: Secondary | ICD-10-CM | POA: Diagnosis not present

## 2021-01-03 DIAGNOSIS — M5134 Other intervertebral disc degeneration, thoracic region: Secondary | ICD-10-CM | POA: Diagnosis not present

## 2021-01-03 DIAGNOSIS — M9903 Segmental and somatic dysfunction of lumbar region: Secondary | ICD-10-CM | POA: Diagnosis not present

## 2021-01-03 DIAGNOSIS — M62830 Muscle spasm of back: Secondary | ICD-10-CM | POA: Diagnosis not present

## 2021-01-03 DIAGNOSIS — M9902 Segmental and somatic dysfunction of thoracic region: Secondary | ICD-10-CM | POA: Diagnosis not present

## 2021-01-22 DIAGNOSIS — M9903 Segmental and somatic dysfunction of lumbar region: Secondary | ICD-10-CM | POA: Diagnosis not present

## 2021-01-22 DIAGNOSIS — M62830 Muscle spasm of back: Secondary | ICD-10-CM | POA: Diagnosis not present

## 2021-01-22 DIAGNOSIS — M5134 Other intervertebral disc degeneration, thoracic region: Secondary | ICD-10-CM | POA: Diagnosis not present

## 2021-01-22 DIAGNOSIS — M9902 Segmental and somatic dysfunction of thoracic region: Secondary | ICD-10-CM | POA: Diagnosis not present

## 2021-02-14 ENCOUNTER — Ambulatory Visit (INDEPENDENT_AMBULATORY_CARE_PROVIDER_SITE_OTHER): Payer: Self-pay | Admitting: Dermatology

## 2021-02-14 ENCOUNTER — Other Ambulatory Visit: Payer: Self-pay

## 2021-02-14 DIAGNOSIS — I8393 Asymptomatic varicose veins of bilateral lower extremities: Secondary | ICD-10-CM

## 2021-02-14 NOTE — Progress Notes (Signed)
   Follow-Up Visit   Subjective  Nicole Zuniga is a 45 y.o. female who presents for the following: spider veins (Pt here for sclerotherapy on her legs today ).  The following portions of the chart were reviewed this encounter and updated as appropriate:   Tobacco  Allergies  Meds  Problems  Med Hx  Surg Hx  Fam Hx     Review of Systems:  No other skin or systemic complaints except as noted in HPI or Assessment and Plan.  Objective  Well appearing patient in no apparent distress; mood and affect are within normal limits.  A focused examination was performed including lower legs. Relevant physical exam findings are noted in the Assessment and Plan.  lower legs Varicose Veins/Spider Veins - Dilated blue, purple or red veins at the lower extremities   Assessment & Plan  Asymptomatic varicose veins of both lower extremities lower legs   - Smaller vessels can be treated by sclerotherapy (a procedure to inject a medicine into the veins to make them disappear) if desired, but the treatment is not covered by insurance. Larger vessels may be covered if symptomatic and we would refer to vascular surgeon if treatment desired.   Intralesional injection - lower legs The patient presents for desired sclerotherapy for desired treatment of desired treatment of small to medium varicosities of the lower legs.  Procedure: The patient was counseled and understands about the effects, side effects and potential risks and complications of the sclerotherapy procedure. The patient was given the opportunity to ask questions. Asclera (polidocanol) 1% (total 2cc) was injected into the varices. In order to ensure correct placement of the catheter in the vein, I drew back slightly to give moderate blood show in the syringe. If there was any evidence or suspicion of extravasation of sclerosant, the area was immediately diluted with a large volume of 0.9% saline. A pressure dressing was applied immediately  to the injected sites. The patient tolerated the procedure well without complication. The patient was instructed in post-operative compression stocking use. The patient understands to call or return immediately if any problems noted.   No follow-ups on file.  IMarye Round, CMA, am acting as scribe for Sarina Ser, MD .  Documentation: I have reviewed the above documentation for accuracy and completeness, and I agree with the above.  Sarina Ser, MD

## 2021-02-14 NOTE — Patient Instructions (Signed)

## 2021-02-15 ENCOUNTER — Encounter: Payer: Self-pay | Admitting: Dermatology

## 2021-02-22 DIAGNOSIS — M9902 Segmental and somatic dysfunction of thoracic region: Secondary | ICD-10-CM | POA: Diagnosis not present

## 2021-02-22 DIAGNOSIS — M9903 Segmental and somatic dysfunction of lumbar region: Secondary | ICD-10-CM | POA: Diagnosis not present

## 2021-02-22 DIAGNOSIS — M62830 Muscle spasm of back: Secondary | ICD-10-CM | POA: Diagnosis not present

## 2021-02-22 DIAGNOSIS — M5134 Other intervertebral disc degeneration, thoracic region: Secondary | ICD-10-CM | POA: Diagnosis not present

## 2021-03-05 ENCOUNTER — Telehealth: Payer: Self-pay

## 2021-03-05 NOTE — Telephone Encounter (Signed)
Left pt msg to return my call.

## 2021-03-05 NOTE — Telephone Encounter (Signed)
Patient left a voicemail on the nurse line. She states she noticed a small black "bubble" on the back of her shin the day after sclerotherapy procedure. The spot is still there and its tender but not a hard knot. She wants to know can this be normal?

## 2021-03-06 NOTE — Telephone Encounter (Signed)
Patient advised of information per Dr. Nehemiah Massed. She will call after the holiday if no improvement.

## 2021-03-06 NOTE — Telephone Encounter (Signed)
Left pt msg to return my call.

## 2021-03-15 ENCOUNTER — Telehealth: Payer: Self-pay

## 2021-03-15 NOTE — Telephone Encounter (Signed)
Left message for patient to return my call.

## 2021-03-15 NOTE — Telephone Encounter (Signed)
Patient called with a update from previous phone call. She had the spot that looked like a blister or some blood under the skin from sclero. She states she has noticed some improvement.   Patient questions a few spots on the right upper calf that was treated. She states the area has appeared in 3 very small dots that are raised and tender to touch.

## 2021-03-19 DIAGNOSIS — M5134 Other intervertebral disc degeneration, thoracic region: Secondary | ICD-10-CM | POA: Diagnosis not present

## 2021-03-19 DIAGNOSIS — M9902 Segmental and somatic dysfunction of thoracic region: Secondary | ICD-10-CM | POA: Diagnosis not present

## 2021-03-19 DIAGNOSIS — M62830 Muscle spasm of back: Secondary | ICD-10-CM | POA: Diagnosis not present

## 2021-03-19 DIAGNOSIS — M9903 Segmental and somatic dysfunction of lumbar region: Secondary | ICD-10-CM | POA: Diagnosis not present

## 2021-03-20 NOTE — Telephone Encounter (Signed)
Left 2nd message for patient to return my call.

## 2021-03-21 NOTE — Telephone Encounter (Signed)
Left patient detailed voicemail with information per Dr. Nehemiah Massed.

## 2021-03-21 NOTE — Telephone Encounter (Signed)
Patient advised of information.

## 2021-04-04 DIAGNOSIS — H5213 Myopia, bilateral: Secondary | ICD-10-CM | POA: Diagnosis not present

## 2021-04-10 DIAGNOSIS — M9902 Segmental and somatic dysfunction of thoracic region: Secondary | ICD-10-CM | POA: Diagnosis not present

## 2021-04-10 DIAGNOSIS — M62830 Muscle spasm of back: Secondary | ICD-10-CM | POA: Diagnosis not present

## 2021-04-10 DIAGNOSIS — M9903 Segmental and somatic dysfunction of lumbar region: Secondary | ICD-10-CM | POA: Diagnosis not present

## 2021-04-10 DIAGNOSIS — M5134 Other intervertebral disc degeneration, thoracic region: Secondary | ICD-10-CM | POA: Diagnosis not present

## 2021-05-01 DIAGNOSIS — M722 Plantar fascial fibromatosis: Secondary | ICD-10-CM | POA: Diagnosis not present

## 2021-05-02 DIAGNOSIS — M5134 Other intervertebral disc degeneration, thoracic region: Secondary | ICD-10-CM | POA: Diagnosis not present

## 2021-05-02 DIAGNOSIS — M9902 Segmental and somatic dysfunction of thoracic region: Secondary | ICD-10-CM | POA: Diagnosis not present

## 2021-05-02 DIAGNOSIS — M9903 Segmental and somatic dysfunction of lumbar region: Secondary | ICD-10-CM | POA: Diagnosis not present

## 2021-05-02 DIAGNOSIS — M62830 Muscle spasm of back: Secondary | ICD-10-CM | POA: Diagnosis not present

## 2021-05-16 DIAGNOSIS — M9903 Segmental and somatic dysfunction of lumbar region: Secondary | ICD-10-CM | POA: Diagnosis not present

## 2021-05-16 DIAGNOSIS — M5134 Other intervertebral disc degeneration, thoracic region: Secondary | ICD-10-CM | POA: Diagnosis not present

## 2021-05-16 DIAGNOSIS — M62830 Muscle spasm of back: Secondary | ICD-10-CM | POA: Diagnosis not present

## 2021-05-16 DIAGNOSIS — M9902 Segmental and somatic dysfunction of thoracic region: Secondary | ICD-10-CM | POA: Diagnosis not present

## 2021-05-30 DIAGNOSIS — M5134 Other intervertebral disc degeneration, thoracic region: Secondary | ICD-10-CM | POA: Diagnosis not present

## 2021-05-30 DIAGNOSIS — M9903 Segmental and somatic dysfunction of lumbar region: Secondary | ICD-10-CM | POA: Diagnosis not present

## 2021-05-30 DIAGNOSIS — M62830 Muscle spasm of back: Secondary | ICD-10-CM | POA: Diagnosis not present

## 2021-05-30 DIAGNOSIS — M9902 Segmental and somatic dysfunction of thoracic region: Secondary | ICD-10-CM | POA: Diagnosis not present

## 2021-06-12 ENCOUNTER — Other Ambulatory Visit: Payer: Self-pay | Admitting: Internal Medicine

## 2021-06-12 DIAGNOSIS — Z1231 Encounter for screening mammogram for malignant neoplasm of breast: Secondary | ICD-10-CM

## 2021-06-13 DIAGNOSIS — M62830 Muscle spasm of back: Secondary | ICD-10-CM | POA: Diagnosis not present

## 2021-06-13 DIAGNOSIS — M5134 Other intervertebral disc degeneration, thoracic region: Secondary | ICD-10-CM | POA: Diagnosis not present

## 2021-06-13 DIAGNOSIS — M9903 Segmental and somatic dysfunction of lumbar region: Secondary | ICD-10-CM | POA: Diagnosis not present

## 2021-06-13 DIAGNOSIS — M9902 Segmental and somatic dysfunction of thoracic region: Secondary | ICD-10-CM | POA: Diagnosis not present

## 2021-06-27 DIAGNOSIS — M9903 Segmental and somatic dysfunction of lumbar region: Secondary | ICD-10-CM | POA: Diagnosis not present

## 2021-06-27 DIAGNOSIS — M62830 Muscle spasm of back: Secondary | ICD-10-CM | POA: Diagnosis not present

## 2021-06-27 DIAGNOSIS — M9902 Segmental and somatic dysfunction of thoracic region: Secondary | ICD-10-CM | POA: Diagnosis not present

## 2021-06-27 DIAGNOSIS — M5134 Other intervertebral disc degeneration, thoracic region: Secondary | ICD-10-CM | POA: Diagnosis not present

## 2021-07-03 DIAGNOSIS — Z79899 Other long term (current) drug therapy: Secondary | ICD-10-CM | POA: Diagnosis not present

## 2021-07-03 DIAGNOSIS — M0880 Other juvenile arthritis, unspecified site: Secondary | ICD-10-CM | POA: Diagnosis not present

## 2021-07-03 DIAGNOSIS — Z6830 Body mass index (BMI) 30.0-30.9, adult: Secondary | ICD-10-CM | POA: Diagnosis not present

## 2021-07-03 DIAGNOSIS — I2510 Atherosclerotic heart disease of native coronary artery without angina pectoris: Secondary | ICD-10-CM | POA: Diagnosis not present

## 2021-07-03 DIAGNOSIS — F419 Anxiety disorder, unspecified: Secondary | ICD-10-CM | POA: Diagnosis not present

## 2021-07-03 DIAGNOSIS — M170 Bilateral primary osteoarthritis of knee: Secondary | ICD-10-CM | POA: Diagnosis not present

## 2021-07-04 ENCOUNTER — Other Ambulatory Visit: Payer: Self-pay

## 2021-07-04 ENCOUNTER — Ambulatory Visit (INDEPENDENT_AMBULATORY_CARE_PROVIDER_SITE_OTHER): Payer: Self-pay | Admitting: Dermatology

## 2021-07-04 DIAGNOSIS — L97819 Non-pressure chronic ulcer of other part of right lower leg with unspecified severity: Secondary | ICD-10-CM

## 2021-07-04 DIAGNOSIS — I83009 Varicose veins of unspecified lower extremity with ulcer of unspecified site: Secondary | ICD-10-CM

## 2021-07-04 DIAGNOSIS — I83028 Varicose veins of left lower extremity with ulcer other part of lower leg: Secondary | ICD-10-CM

## 2021-07-04 DIAGNOSIS — I83018 Varicose veins of right lower extremity with ulcer other part of lower leg: Secondary | ICD-10-CM

## 2021-07-04 NOTE — Progress Notes (Signed)
? ?  Follow-Up Visit ?  ?Subjective  ?Nicole Zuniga is a 46 y.o. female who presents for the following: sclerotherapy (4 months f/u on varicose veins on her legs ). ? ?The following portions of the chart were reviewed this encounter and updated as appropriate:  ? Tobacco  Allergies  Meds  Problems  Med Hx  Surg Hx  Fam Hx   ?  ?Review of Systems:  No other skin or systemic complaints except as noted in HPI or Assessment and Plan. ? ?Objective  ?Well appearing patient in no apparent distress; mood and affect are within normal limits. ? ?A focused examination was performed including lower legs. Relevant physical exam findings are noted in the Assessment and Plan. ? ?lower legs ? ? ? ? ? ? ? ? ? ? ? ? ? ? ? ? ? ? ? ? ? ? ? ? ?Assessment & Plan  ?Varicose ulcer of lower extremity, unspecified laterality ([place]) ?lower legs ? ?Intralesional injection - lower legs ?The patient presents for desired sclerotherapy for desired treatment of desired treatment of small to medium varicosities of the lower legs. ? ?Procedure: The patient was counseled and understands about the effects, side effects and potential risks and complications of the sclerotherapy procedure. The patient was given the opportunity to ask questions. Asclera (polidocanol) 1% (total 2cc) was injected into the varices. In order to ensure correct placement of the catheter in the vein, I drew back slightly to give moderate blood show in the syringe. If there was any evidence or suspicion of extravasation of sclerosant, the area was immediately diluted with a large volume of 0.9% saline. A pressure dressing was applied immediately to the injected sites. The patient tolerated the procedure well without complication. The patient was instructed in post-operative compression stocking use. The patient understands to call or return immediately if any problems noted. ? ?Return in about 4 months (around 11/03/2021) for sclerotherapy . ? ?I, Marye Round, CMA, am  acting as scribe for Sarina Ser, MD .  ?Documentation: I have reviewed the above documentation for accuracy and completeness, and I agree with the above. ? ?Sarina Ser, MD ? ?

## 2021-07-04 NOTE — Patient Instructions (Signed)

## 2021-07-08 ENCOUNTER — Encounter: Payer: Self-pay | Admitting: Dermatology

## 2021-07-11 DIAGNOSIS — M5134 Other intervertebral disc degeneration, thoracic region: Secondary | ICD-10-CM | POA: Diagnosis not present

## 2021-07-11 DIAGNOSIS — M9902 Segmental and somatic dysfunction of thoracic region: Secondary | ICD-10-CM | POA: Diagnosis not present

## 2021-07-11 DIAGNOSIS — M9903 Segmental and somatic dysfunction of lumbar region: Secondary | ICD-10-CM | POA: Diagnosis not present

## 2021-07-11 DIAGNOSIS — M62830 Muscle spasm of back: Secondary | ICD-10-CM | POA: Diagnosis not present

## 2021-07-13 DIAGNOSIS — M7062 Trochanteric bursitis, left hip: Secondary | ICD-10-CM | POA: Diagnosis not present

## 2021-07-16 NOTE — Progress Notes (Signed)
Cardiology Office Note ? ?Date:  07/17/2021  ? ?ID:  ROSALENE WARDROP, DOB Oct 16, 1975, MRN [id number] ? ?PCP:  [name], MD  ? ?Chief Complaint  ?Patient presents with  ? 12 month follow up   ?  "Doing well." Medications reviewed by the patient verbally.   ? ? ?HPI:  ?46 y.o. female w/ a h/o  ?RA, on enbrel ?anxiety, ?panic attacks,  ?admitted 1/16 with chest pain and NSTEMI -  ?trop peaked @ 4.38.  ?suggestive but not conclusive of spontaneous coronary artery dissection. ?Echo 1/17 showed nl EF w/o wma's. ?Presents for follow-up after recent hospitalization for non-STEMI, coronary artery disease ? ?LOV 4/22 ?Works at a [employer], academic ?In follow-up she reports having issues with ?Plantar fascitis ?Hip issues, cortisone in hip ?Followed by chiropractor ? ?RA stable ? ?Denies any chest pain or shortness of breath concerning for angina ?No regular exercise program ? ?Continues on bisoprolol 5 mg daily, losartan 25 daily, Crestor 5 daily, aspirin ?No significant leg edema, no abdominal distention, CHF symptoms ? ?Lab work from April 2022 ?LDL 67 total cholesterol 138 ?HBA1C 5.6 ? ?EKG personally reviewed by myself on todays visit ?Shows normal sinus rhythm rate 60 bpm nonspecific ST-T wave abnormality ? ?Other past medical history reviewed ? Cardiac catheterization performed 05/02/2017 ?Mid Cx lesion is 50% stenosed. ?The left ventricular systolic function is normal. ?LV end diastolic pressure is mildly elevated. ?The left ventricular ejection fraction is 55-65% by visual estimate. ?  ?1.  Moderate one-vessel coronary artery disease with 50% stenosis in the mid left circumflex that has an appearance that is suggestive but not conclusive of spontaneous coronary artery dissection. ?2.  Normal LV systolic function and mildly elevated left ventricular end-diastolic pressure. ? ? ?PMH:   has a past medical history of Allergic rhinitis, Anxiety, Basal cell carcinoma (2006), Essential hypertension, Non-ST elevation (NSTEMI)  myocardial infarction ([place]) (04/2017), Panic attack, and RA (rheumatoid arthritis) ([place]). ? ?PSH:    ?Past Surgical History:  ?Procedure Laterality Date  ? HERNIA REPAIR  07/2010  ? [facility]  ? KNEE SURGERY Left   ? LEFT HEART CATH AND CORONARY ANGIOGRAPHY N/A 05/02/2017  ? Procedure: LEFT HEART CATH AND CORONARY ANGIOGRAPHY;  Surgeon: Wellington Hampshire, MD;  Location: [facility];  Service: Cardiovascular;  Laterality: N/A;  ? NECK SURGERY    ? Excision of lymph node  ? ? ?Current Outpatient Medications  ?Medication Sig Dispense Refill  ? ALPRAZolam (XANAX) 0.25 MG tablet Take 0.25 mg by mouth at bedtime as needed for anxiety.   5  ? ASPIRIN LOW DOSE 81 MG EC tablet TAKE 1 TABLET BY MOUTH EVERY DAY 30 tablet 9  ? bisoprolol (ZEBETA) 5 MG tablet Take 5 mg by mouth daily.  0  ? Cholecalciferol (VITAMIN D) 2000 units tablet Take 2,000 Units by mouth daily.    ? ENBREL SURECLICK 50 MG/ML injection Inject 50 mg into the skin once a week. Takes on the weekend  2  ? fluticasone (FLONASE) 50 MCG/ACT nasal spray Place into the nose.    ? levocetirizine (XYZAL) 5 MG tablet Take 5 mg by mouth daily.    ? losartan (COZAAR) 25 MG tablet TAKE 1 TABLET BY MOUTH DAILY 90 tablet 3  ? Multiple Vitamin (MULTI-VITAMINS) TABS Take 1 tablet by mouth daily.    ? Rhubarb (ESTROVEN COMPLETE PO) Take by mouth.    ? rosuvastatin (CRESTOR) 5 MG tablet TAKE 1 TABLET(5 MG) BY MOUTH DAILY 30 tablet 11  ? ?  No current facility-administered medications for this visit.  ? ? ?Allergies:   Lisinopril  ? ?Social History:  The patient  reports that she has never smoked. She has never used smokeless tobacco. She reports current alcohol use. She reports that she does not use drugs.  ? ?Family History:   family history includes CAD in her father; Congestive Heart Failure in her father; Diabetes in her mother and sister; Hyperlipidemia in her father and mother; Hypertension in her father and mother; Leukemia in her paternal grandfather.  ? ? ?Review of  Systems: ?Review of Systems  ?Constitutional: Negative.   ?HENT: Negative.    ?Respiratory: Negative.    ?Cardiovascular: Negative.   ?Gastrointestinal: Negative.   ?Musculoskeletal: Negative.   ?Neurological: Negative.   ?Psychiatric/Behavioral: Negative.    ?All other systems reviewed and are negative. ? ?PHYSICAL EXAM: ?VS:  BP (!) 150/110 (BP Location: Left Arm, Patient Position: Sitting, Cuff Size: Normal)   Pulse 60   Ht '5\' 9"'$  (1.753 m)   Wt 91.9 kg   SpO2 98%   BMI 29.90 kg/m?  , BMI Body mass index is 29.9 kg/m?Marland Kitchen ?Constitutional:  oriented to person, place, and time. No distress.  ?HENT:  ?Head: Grossly normal ?Eyes:  no discharge. No scleral icterus.  ?Neck: No JVD, no carotid bruits  ?Cardiovascular: Regular rate and rhythm, no murmurs appreciated ?Pulmonary/Chest: Clear to auscultation bilaterally, no wheezes or rails ?Abdominal: Soft.  no distension.  no tenderness.  ?Musculoskeletal: Normal range of motion ?Neurological:  normal muscle tone. Coordination normal. No atrophy ?Skin: Skin warm and dry ?Psychiatric: normal affect, pleasant ? ?Recent Labs: ?No results found for requested labs within last 8760 hours.  ? ? ?Lipid Panel ?No results found for: CHOL, HDL, LDLCALC, TRIG ?  ? ?Wt Readings from Last 3 Encounters:  ?07/17/21 91.9 kg  ?08/31/20 93.9 kg  ?07/18/20 93.6 kg  ?  ? ? ?ASSESSMENT AND PLAN: ? ?NSTEMI (non-ST elevated myocardial infarction) ([place])  ?Felt to be secondary to coronary dissection mid left circumflex nonocclusive ?Continue aspirin, Crestor, beta-blocker, losartan ?Denies recent anginal symptoms ?No further work-up needed, recommend she closely monitor blood pressure ? ?Essential hypertension -  ?Was rushing from work, blood pressure elevated ?Reports typically well controlled ?She will measure at home and call our office with numbers ? ?Rheumatoid arthritis, involving unspecified site, unspecified rheumatoid factor presence ([place]) ?Managed by primary care and  rheumatology ?Reports symptoms stable ? ? ? Total encounter time more than 30 minutes ? Greater than 50% was spent in counseling and coordination of care with the patient ? ? ?No orders of the defined types were placed in this encounter. ? ? ? ?Signed, ?Esmond Plants, M.D., Ph.D. ?07/17/2021  ?[place] ?[phone] ? ?

## 2021-07-17 ENCOUNTER — Encounter: Payer: Self-pay | Admitting: Cardiovascular Disease

## 2021-07-17 ENCOUNTER — Ambulatory Visit (INDEPENDENT_AMBULATORY_CARE_PROVIDER_SITE_OTHER): Payer: BC Managed Care – PPO | Admitting: Cardiovascular Disease

## 2021-07-17 VITALS — BP 150/110 | HR 60 | Ht 69.0 in | Wt 202.5 lb

## 2021-07-17 DIAGNOSIS — M069 Rheumatoid arthritis, unspecified: Secondary | ICD-10-CM | POA: Diagnosis not present

## 2021-07-17 DIAGNOSIS — I25118 Atherosclerotic heart disease of native coronary artery with other forms of angina pectoris: Secondary | ICD-10-CM

## 2021-07-17 DIAGNOSIS — I10 Essential (primary) hypertension: Secondary | ICD-10-CM

## 2021-07-17 DIAGNOSIS — I214 Non-ST elevation (NSTEMI) myocardial infarction: Secondary | ICD-10-CM | POA: Diagnosis not present

## 2021-07-17 MED ORDER — LOSARTAN POTASSIUM 25 MG PO TABS: 25.0000 mg | ORAL_TABLET | Freq: Every day | ORAL | 3 refills | Status: DC

## 2021-07-17 MED ORDER — ROSUVASTATIN CALCIUM 5 MG PO TABS: ORAL_TABLET | ORAL | 11 refills | Status: DC

## 2021-07-17 MED ORDER — BISOPROLOL FUMARATE 5 MG PO TABS: 5.0000 mg | ORAL_TABLET | Freq: Every day | ORAL | 3 refills | Status: AC

## 2021-07-17 NOTE — Patient Instructions (Signed)
Medication Instructions:  No changes  If you need a refill on your cardiac medications before your next appointment, please call your pharmacy.   Lab work: No new labs needed  Testing/Procedures: No new testing needed  Follow-Up: At [facility], you and your health needs are our priority.  As part of our continuing mission to provide you with exceptional heart care, we have created designated Provider Care Teams.  These Care Teams include your primary Cardiologist (physician) and Advanced Practice Providers (APPs -  Physician Assistants and Nurse Practitioners) who all work together to provide you with the care you need, when you need it.  You will need a follow up appointment in 12 months  Providers on your designated Care Team:   Christopher Berge, NP Ryan Dunn, PA-C Cadence Furth, PA-C  COVID-19 Vaccine Information can be found at: [email or web address] For questions related to vaccine distribution or appointments, please email [email or web address] or call [phone].   

## 2021-07-23 ENCOUNTER — Ambulatory Visit
Admission: RE | Admit: 2021-07-23 | Discharge: 2021-07-23 | Disposition: A | Discharge status: Home / Self Care | Payer: BC Managed Care – PPO | Source: Ambulatory Visit | Attending: Internal Medicine | Admitting: Internal Medicine

## 2021-07-23 DIAGNOSIS — Z1231 Encounter for screening mammogram for malignant neoplasm of breast: Secondary | ICD-10-CM

## 2021-07-26 ENCOUNTER — Other Ambulatory Visit: Payer: Self-pay | Admitting: Internal Medicine

## 2021-07-26 DIAGNOSIS — Z1389 Encounter for screening for other disorder: Secondary | ICD-10-CM | POA: Diagnosis not present

## 2021-07-26 DIAGNOSIS — R928 Other abnormal and inconclusive findings on diagnostic imaging of breast: Secondary | ICD-10-CM

## 2021-07-26 DIAGNOSIS — Z0000 Encounter for general adult medical examination without abnormal findings: Secondary | ICD-10-CM | POA: Diagnosis not present

## 2021-07-26 DIAGNOSIS — Z131 Encounter for screening for diabetes mellitus: Secondary | ICD-10-CM | POA: Diagnosis not present

## 2021-07-26 DIAGNOSIS — Z13220 Encounter for screening for lipoid disorders: Secondary | ICD-10-CM | POA: Diagnosis not present

## 2021-07-26 DIAGNOSIS — N63 Unspecified lump in unspecified breast: Secondary | ICD-10-CM

## 2021-08-01 DIAGNOSIS — M9903 Segmental and somatic dysfunction of lumbar region: Secondary | ICD-10-CM | POA: Diagnosis not present

## 2021-08-01 DIAGNOSIS — M9902 Segmental and somatic dysfunction of thoracic region: Secondary | ICD-10-CM | POA: Diagnosis not present

## 2021-08-01 DIAGNOSIS — M5134 Other intervertebral disc degeneration, thoracic region: Secondary | ICD-10-CM | POA: Diagnosis not present

## 2021-08-01 DIAGNOSIS — M62830 Muscle spasm of back: Secondary | ICD-10-CM | POA: Diagnosis not present

## 2021-08-02 DIAGNOSIS — Z0000 Encounter for general adult medical examination without abnormal findings: Secondary | ICD-10-CM | POA: Diagnosis not present

## 2021-08-15 ENCOUNTER — Ambulatory Visit
Admission: RE | Admit: 2021-08-15 | Discharge: 2021-08-15 | Disposition: A | Discharge status: Home / Self Care | Payer: BC Managed Care – PPO | Source: Ambulatory Visit | Attending: Internal Medicine | Admitting: Internal Medicine

## 2021-08-15 DIAGNOSIS — R928 Other abnormal and inconclusive findings on diagnostic imaging of breast: Secondary | ICD-10-CM

## 2021-08-15 DIAGNOSIS — N63 Unspecified lump in unspecified breast: Secondary | ICD-10-CM

## 2021-08-15 DIAGNOSIS — N6012 Diffuse cystic mastopathy of left breast: Secondary | ICD-10-CM | POA: Diagnosis not present

## 2021-08-20 DIAGNOSIS — M7062 Trochanteric bursitis, left hip: Secondary | ICD-10-CM | POA: Diagnosis not present

## 2021-08-20 DIAGNOSIS — M25852 Other specified joint disorders, left hip: Secondary | ICD-10-CM | POA: Diagnosis not present

## 2021-08-20 DIAGNOSIS — M1612 Unilateral primary osteoarthritis, left hip: Secondary | ICD-10-CM | POA: Diagnosis not present

## 2021-08-20 DIAGNOSIS — M25552 Pain in left hip: Secondary | ICD-10-CM | POA: Diagnosis not present

## 2021-08-22 DIAGNOSIS — M62830 Muscle spasm of back: Secondary | ICD-10-CM | POA: Diagnosis not present

## 2021-08-22 DIAGNOSIS — M9902 Segmental and somatic dysfunction of thoracic region: Secondary | ICD-10-CM | POA: Diagnosis not present

## 2021-08-22 DIAGNOSIS — M9903 Segmental and somatic dysfunction of lumbar region: Secondary | ICD-10-CM | POA: Diagnosis not present

## 2021-08-22 DIAGNOSIS — M5134 Other intervertebral disc degeneration, thoracic region: Secondary | ICD-10-CM | POA: Diagnosis not present

## 2021-08-30 ENCOUNTER — Other Ambulatory Visit: Payer: Self-pay | Admitting: Student

## 2021-08-30 DIAGNOSIS — M25552 Pain in left hip: Secondary | ICD-10-CM

## 2021-08-30 DIAGNOSIS — M1612 Unilateral primary osteoarthritis, left hip: Secondary | ICD-10-CM

## 2021-08-30 DIAGNOSIS — M25852 Other specified joint disorders, left hip: Secondary | ICD-10-CM

## 2021-09-12 DIAGNOSIS — M62830 Muscle spasm of back: Secondary | ICD-10-CM | POA: Diagnosis not present

## 2021-09-12 DIAGNOSIS — M9903 Segmental and somatic dysfunction of lumbar region: Secondary | ICD-10-CM | POA: Diagnosis not present

## 2021-09-12 DIAGNOSIS — M5134 Other intervertebral disc degeneration, thoracic region: Secondary | ICD-10-CM | POA: Diagnosis not present

## 2021-09-12 DIAGNOSIS — M9902 Segmental and somatic dysfunction of thoracic region: Secondary | ICD-10-CM | POA: Diagnosis not present

## 2021-09-21 ENCOUNTER — Ambulatory Visit
Admission: RE | Admit: 2021-09-21 | Discharge: 2021-09-21 | Disposition: A | Discharge status: Home / Self Care | Payer: BC Managed Care – PPO | Source: Ambulatory Visit | Attending: Student | Admitting: Student

## 2021-09-21 DIAGNOSIS — G8929 Other chronic pain: Secondary | ICD-10-CM | POA: Diagnosis not present

## 2021-09-21 DIAGNOSIS — M25852 Other specified joint disorders, left hip: Secondary | ICD-10-CM

## 2021-09-21 DIAGNOSIS — M1612 Unilateral primary osteoarthritis, left hip: Secondary | ICD-10-CM

## 2021-09-21 DIAGNOSIS — M25552 Pain in left hip: Secondary | ICD-10-CM

## 2021-09-21 DIAGNOSIS — S76312A Strain of muscle, fascia and tendon of the posterior muscle group at thigh level, left thigh, initial encounter: Secondary | ICD-10-CM | POA: Diagnosis not present

## 2021-09-21 MED ORDER — IOPAMIDOL (ISOVUE-M 300) INJECTION 61%
10.0000 mL | Freq: Once | INTRAMUSCULAR | Status: AC | PRN
  Administered 2021-09-21: 10 mL via INTRA_ARTICULAR

## 2021-10-04 ENCOUNTER — Ambulatory Visit: Payer: BC Managed Care – PPO | Admitting: Dermatology

## 2021-10-05 ENCOUNTER — Other Ambulatory Visit: Payer: Self-pay | Admitting: Cardiovascular Disease

## 2021-10-08 DIAGNOSIS — S76011A Strain of muscle, fascia and tendon of right hip, initial encounter: Secondary | ICD-10-CM | POA: Diagnosis not present

## 2021-10-08 DIAGNOSIS — S73192A Other sprain of left hip, initial encounter: Secondary | ICD-10-CM | POA: Diagnosis not present

## 2021-10-08 DIAGNOSIS — S76012A Strain of muscle, fascia and tendon of left hip, initial encounter: Secondary | ICD-10-CM | POA: Diagnosis not present

## 2021-10-10 DIAGNOSIS — M9902 Segmental and somatic dysfunction of thoracic region: Secondary | ICD-10-CM | POA: Diagnosis not present

## 2021-10-10 DIAGNOSIS — M62830 Muscle spasm of back: Secondary | ICD-10-CM | POA: Diagnosis not present

## 2021-10-10 DIAGNOSIS — M9903 Segmental and somatic dysfunction of lumbar region: Secondary | ICD-10-CM | POA: Diagnosis not present

## 2021-10-10 DIAGNOSIS — M5134 Other intervertebral disc degeneration, thoracic region: Secondary | ICD-10-CM | POA: Diagnosis not present

## 2021-10-30 DIAGNOSIS — S76012A Strain of muscle, fascia and tendon of left hip, initial encounter: Secondary | ICD-10-CM | POA: Diagnosis not present

## 2021-10-31 DIAGNOSIS — M5134 Other intervertebral disc degeneration, thoracic region: Secondary | ICD-10-CM | POA: Diagnosis not present

## 2021-10-31 DIAGNOSIS — M62830 Muscle spasm of back: Secondary | ICD-10-CM | POA: Diagnosis not present

## 2021-10-31 DIAGNOSIS — M9902 Segmental and somatic dysfunction of thoracic region: Secondary | ICD-10-CM | POA: Diagnosis not present

## 2021-10-31 DIAGNOSIS — M9903 Segmental and somatic dysfunction of lumbar region: Secondary | ICD-10-CM | POA: Diagnosis not present

## 2021-11-01 DIAGNOSIS — L570 Actinic keratosis: Secondary | ICD-10-CM | POA: Diagnosis not present

## 2021-11-01 DIAGNOSIS — D0472 Carcinoma in situ of skin of left lower limb, including hip: Secondary | ICD-10-CM | POA: Diagnosis not present

## 2021-11-01 DIAGNOSIS — D0462 Carcinoma in situ of skin of left upper limb, including shoulder: Secondary | ICD-10-CM | POA: Diagnosis not present

## 2021-11-01 DIAGNOSIS — B078 Other viral warts: Secondary | ICD-10-CM | POA: Diagnosis not present

## 2021-11-06 DIAGNOSIS — S76012A Strain of muscle, fascia and tendon of left hip, initial encounter: Secondary | ICD-10-CM | POA: Diagnosis not present

## 2021-11-14 DIAGNOSIS — S76012A Strain of muscle, fascia and tendon of left hip, initial encounter: Secondary | ICD-10-CM | POA: Diagnosis not present

## 2021-11-21 DIAGNOSIS — M5134 Other intervertebral disc degeneration, thoracic region: Secondary | ICD-10-CM | POA: Diagnosis not present

## 2021-11-21 DIAGNOSIS — M62830 Muscle spasm of back: Secondary | ICD-10-CM | POA: Diagnosis not present

## 2021-11-21 DIAGNOSIS — M9903 Segmental and somatic dysfunction of lumbar region: Secondary | ICD-10-CM | POA: Diagnosis not present

## 2021-11-21 DIAGNOSIS — M9902 Segmental and somatic dysfunction of thoracic region: Secondary | ICD-10-CM | POA: Diagnosis not present

## 2021-11-27 DIAGNOSIS — S76012A Strain of muscle, fascia and tendon of left hip, initial encounter: Secondary | ICD-10-CM | POA: Diagnosis not present

## 2021-12-10 DIAGNOSIS — S73192D Other sprain of left hip, subsequent encounter: Secondary | ICD-10-CM | POA: Diagnosis not present

## 2021-12-10 DIAGNOSIS — S76012D Strain of muscle, fascia and tendon of left hip, subsequent encounter: Secondary | ICD-10-CM | POA: Diagnosis not present

## 2021-12-11 DIAGNOSIS — S76012A Strain of muscle, fascia and tendon of left hip, initial encounter: Secondary | ICD-10-CM | POA: Diagnosis not present

## 2021-12-12 DIAGNOSIS — M9902 Segmental and somatic dysfunction of thoracic region: Secondary | ICD-10-CM | POA: Diagnosis not present

## 2021-12-12 DIAGNOSIS — M9903 Segmental and somatic dysfunction of lumbar region: Secondary | ICD-10-CM | POA: Diagnosis not present

## 2021-12-12 DIAGNOSIS — M62830 Muscle spasm of back: Secondary | ICD-10-CM | POA: Diagnosis not present

## 2021-12-12 DIAGNOSIS — M5134 Other intervertebral disc degeneration, thoracic region: Secondary | ICD-10-CM | POA: Diagnosis not present

## 2021-12-12 NOTE — Progress Notes (Unsigned)
PCP: [name], MD   No chief complaint on file.   HPI:      Ms. GYANNA JAREMA is a 46 y.o. [phone] whose LMP was No LMP recorded. (Menstrual status: Irregular Periods)., presents today for her annual examination.  Her menses are irregular due to perimenopause. LMP lasted 2 wks, light to mod flow, no dysmen. PMP was 6 months prior. Started spotting today.  Had AUB with menses Q2 wks to 2 months 11/21 with neg GYN u/s and neg EMB 11/21 Hx of irreg menses off and on since 2020. She does have vasomotor sx, controlled with black cohosh in past but no longer working. Changed to estroven with sx relief.   Hx of HTN, mild non STEMI 1/19.  Sex activity: single partner, contraception - vasectomy. She does not have vaginal dryness.  Last Pap: 06/04/18  Results were: no abnormalities /neg HPV DNA 2018.   Last mammogram: 08/15/21 Results were: normal after addl views LT breast--routine follow-up in 12 months There is no FH of breast cancer. There is no FH of ovarian cancer. The patient does do self-breast exams.  Colonoscopy: never; PCP said to hold off for now.   Tobacco use: The patient denies current or previous tobacco use. Alcohol use: none Exercise: very active  She does get adequate calcium and Vitamin D in her diet.  Labs with PCP.   Past Medical History:  Diagnosis Date   Allergic rhinitis    Anxiety    Basal cell carcinoma 2006   left superior lateral chest at breast   Essential hypertension    Non-ST elevation (NSTEMI) myocardial infarction ([place]) 04/2017   Panic attack    RA (rheumatoid arthritis) ([place])     Past Surgical History:  Procedure Laterality Date   HERNIA REPAIR  07/2010   [facility]   KNEE SURGERY Left    LEFT HEART CATH AND CORONARY ANGIOGRAPHY N/A 05/02/2017   Procedure: LEFT HEART CATH AND CORONARY ANGIOGRAPHY;  Surgeon: Wellington Hampshire, MD;  Location: [facility];  Service: Cardiovascular;  Laterality: N/A;   NECK SURGERY     Excision of  lymph node    Family History  Problem Relation Age of Onset   Hypertension Mother    Hyperlipidemia Mother    Diabetes Mother    CAD Father        s/p CABG in his late 75's   Hypertension Father    Hyperlipidemia Father    Congestive Heart Failure Father    Diabetes Sister    Leukemia Paternal Grandfather     Social History   Socioeconomic History   Marital status: Married    Spouse name: Mallie Mussel   Number of children: 3   Years of education: Not on file   Highest education level: Not on file  Occupational History   Occupation: Landscape architect  Tobacco Use   Smoking status: Never   Smokeless tobacco: Never  Vaping Use   Vaping Use: Never used  Substance and Sexual Activity   Alcohol use: Yes    Comment: rare   Drug use: No   Sexual activity: Yes    Partners: Male    Birth control/protection: Other-see comments    Comment: Vasectomy  Other Topics Concern   Not on file  Social History Narrative   Lives locally with her husband and three children (ages 4, 29, 89).  Works as Landscape architect.  Exercises regularly.   Social Determinants of Radio broadcast assistant  Strain: Not on file  Food Insecurity: Not on file  Transportation Needs: Not on file  Physical Activity: Insufficiently Active (05/06/2017)   Exercise Vital Sign    Days of Exercise per Week: 4 days    Minutes of Exercise per Session: 30 min  Stress: Stress Concern Present (05/06/2017)   [place]    Feeling of Stress : To some extent  Social Connections: Socially Integrated (05/06/2017)   Social Connection and Isolation Panel [NHANES]    Frequency of Communication with Friends and Family: More than three times a week    Frequency of Social Gatherings with Friends and Family: Once a week    Attends Religious Services: More than 4 times per year    Active Member of [employer] or Organizations: Yes    Attends Music therapist: More  than 4 times per year    Marital Status: Married  Human resources officer Violence: Not At Risk (05/06/2017)   Humiliation, Afraid, Rape, and Kick questionnaire    Fear of Current or Ex-Partner: No    Emotionally Abused: No    Physically Abused: No    Sexually Abused: No     Current Outpatient Medications:    ALPRAZolam (XANAX) 0.25 MG tablet, Take 0.25 mg by mouth at bedtime as needed for anxiety. , Disp: , Rfl: 5   ASPIRIN LOW DOSE 81 MG tablet, TAKE 1 TABLET BY MOUTH EVERY DAY, Disp: 30 tablet, Rfl: 9   bisoprolol (ZEBETA) 5 MG tablet, Take 1 tablet (5 mg total) by mouth daily., Disp: 90 tablet, Rfl: 3   Cholecalciferol (VITAMIN D) 2000 units tablet, Take 2,000 Units by mouth daily., Disp: , Rfl:    ENBREL SURECLICK 50 MG/ML injection, Inject 50 mg into the skin once a week. Takes on the weekend, Disp: , Rfl: 2   fluticasone (FLONASE) 50 MCG/ACT nasal spray, Place into the nose., Disp: , Rfl:    levocetirizine (XYZAL) 5 MG tablet, Take 5 mg by mouth daily., Disp: , Rfl:    losartan (COZAAR) 25 MG tablet, Take 1 tablet (25 mg total) by mouth daily., Disp: 90 tablet, Rfl: 3   Multiple Vitamin (MULTI-VITAMINS) TABS, Take 1 tablet by mouth daily., Disp: , Rfl:    Rhubarb (ESTROVEN COMPLETE PO), Take by mouth., Disp: , Rfl:    rosuvastatin (CRESTOR) 5 MG tablet, TAKE 1 TABLET(5 MG) BY MOUTH DAILY, Disp: 30 tablet, Rfl: 11     ROS:  Review of Systems  Constitutional:  Negative for fatigue, fever and unexpected weight change.  Respiratory:  Negative for cough, shortness of breath and wheezing.   Cardiovascular:  Negative for chest pain, palpitations and leg swelling.  Gastrointestinal:  Negative for blood in stool, constipation, diarrhea, nausea and vomiting.  Endocrine: Negative for cold intolerance, heat intolerance and polyuria.  Genitourinary:  Negative for dyspareunia, dysuria, flank pain, frequency, genital sores, hematuria, menstrual problem, pelvic pain, urgency, vaginal bleeding,  vaginal discharge and vaginal pain.  Musculoskeletal:  Negative for back pain, joint swelling and myalgias.  Skin:  Negative for rash.  Neurological:  Negative for dizziness, syncope, light-headedness, numbness and headaches.  Hematological:  Negative for adenopathy.  Psychiatric/Behavioral:  Positive for agitation. Negative for confusion, sleep disturbance and suicidal ideas. The patient is not nervous/anxious.   BREAST: No symptoms    Objective: There were no vitals taken for this visit.   Physical Exam Constitutional:      Appearance: She is well-developed.  Genitourinary:  Vulva normal.     Right Labia: No rash, tenderness or lesions.    Left Labia: No tenderness, lesions or rash.    No vaginal discharge, erythema or tenderness.      Right Adnexa: not tender and no mass present.    Left Adnexa: not tender and no mass present.    No cervical friability or polyp.     Uterus is not enlarged or tender.  Breasts:    Right: No mass, nipple discharge, skin change or tenderness.     Left: No mass, nipple discharge, skin change or tenderness.  Neck:     Thyroid: No thyromegaly.  Cardiovascular:     Rate and Rhythm: Normal rate and regular rhythm.     Heart sounds: Normal heart sounds. No murmur heard. Pulmonary:     Effort: Pulmonary effort is normal.     Breath sounds: Normal breath sounds.  Abdominal:     Palpations: Abdomen is soft.     Tenderness: There is no abdominal tenderness. There is no guarding or rebound.  Musculoskeletal:        General: Normal range of motion.     Cervical back: Normal range of motion.  Lymphadenopathy:     Cervical: No cervical adenopathy.  Neurological:     General: No focal deficit present.     Mental Status: She is alert and oriented to person, place, and time.     Cranial Nerves: No cranial nerve deficit.  Skin:    General: Skin is warm and dry.  Psychiatric:        Mood and Affect: Mood normal.        Behavior: Behavior normal.         Thought Content: Thought content normal.        Judgment: Judgment normal.  Vitals reviewed.     Assessment/Plan:  Encounter for annual routine gynecological examination  Encounter for screening mammogram for malignant neoplasm of breast; pt current on mammo  Perimenopause--f/u prn AUB. Neg EMB 11/21. Discussed IUD/prog only options prn. Pt to follow for now.  Screening for colon cancer--colonoscopy discussed. PCP suggested pt hold off for now. F/u prn.           GYN counsel breast self exam, mammography screening, menopause, adequate intake of calcium and vitamin D, diet and exercise    F/U  No follow-ups on file.  Margrit Minner B. Jaxson Keener, PA-C 12/12/2021 8:00 PM

## 2021-12-13 ENCOUNTER — Ambulatory Visit (INDEPENDENT_AMBULATORY_CARE_PROVIDER_SITE_OTHER): Payer: BC Managed Care – PPO | Admitting: Obstetrics and Gynecology

## 2021-12-13 ENCOUNTER — Other Ambulatory Visit (HOSPITAL_COMMUNITY)
Admission: RE | Admit: 2021-12-13 | Discharge: 2021-12-13 | Disposition: A | Discharge status: Home / Self Care | Payer: BC Managed Care – PPO | Source: Ambulatory Visit | Attending: Obstetrics and Gynecology | Admitting: Obstetrics and Gynecology

## 2021-12-13 ENCOUNTER — Encounter: Payer: Self-pay | Admitting: Obstetrics and Gynecology

## 2021-12-13 VITALS — BP 120/80 | Ht 69.0 in | Wt 184.0 lb

## 2021-12-13 DIAGNOSIS — Z01419 Encounter for gynecological examination (general) (routine) without abnormal findings: Secondary | ICD-10-CM

## 2021-12-13 DIAGNOSIS — Z124 Encounter for screening for malignant neoplasm of cervix: Secondary | ICD-10-CM | POA: Diagnosis not present

## 2021-12-13 DIAGNOSIS — Z1231 Encounter for screening mammogram for malignant neoplasm of breast: Secondary | ICD-10-CM | POA: Diagnosis not present

## 2021-12-13 DIAGNOSIS — Z1211 Encounter for screening for malignant neoplasm of colon: Secondary | ICD-10-CM

## 2021-12-13 DIAGNOSIS — N951 Menopausal and female climacteric states: Secondary | ICD-10-CM

## 2021-12-13 DIAGNOSIS — Z1151 Encounter for screening for human papillomavirus (HPV): Secondary | ICD-10-CM | POA: Diagnosis not present

## 2021-12-13 NOTE — Patient Instructions (Signed)
I value your feedback and you entrusting us with your care. If you get a [place] [facility] patient survey, I would appreciate you taking the time to let us know about your experience today. Thank you! ? ? ?

## 2021-12-18 ENCOUNTER — Other Ambulatory Visit: Payer: Self-pay

## 2021-12-18 ENCOUNTER — Telehealth: Payer: Self-pay

## 2021-12-18 DIAGNOSIS — Z1211 Encounter for screening for malignant neoplasm of colon: Secondary | ICD-10-CM

## 2021-12-18 MED ORDER — NA SULFATE-K SULFATE-MG SULF 17.5-3.13-1.6 GM/177ML PO SOLN: 1.0000 | Freq: Once | ORAL | 0 refills | Status: AC

## 2021-12-18 NOTE — Telephone Encounter (Signed)
Gastroenterology Pre-Procedure Review  Request Date: 02/08/22 Requesting Physician: Dr. Vicente Males  PATIENT REVIEW QUESTIONS: The patient responded to the following health history questions as indicated:    1. Are you having any GI issues? no 2. Do you have a personal history of Polyps? no 3. Do you have a family history of Colon Cancer or Polyps? yes (mother colon polyps) 4. Diabetes Mellitus? no 5. Joint replacements in the past 12 months?no 6. Major health problems in the past 3 months?no 7. Any artificial heart valves, MVP, or defibrillator?no    MEDICATIONS & ALLERGIES:    Patient reports the following regarding taking any anticoagulation/antiplatelet therapy:   Plavix, Coumadin, Eliquis, Xarelto, Lovenox, Pradaxa, Brilinta, or Effient? no Aspirin? yes (81 mg daily)  Patient confirms/reports the following medications:  Current Outpatient Medications  Medication Sig Dispense Refill   ALPRAZolam (XANAX) 0.25 MG tablet Take 0.25 mg by mouth at bedtime as needed for anxiety.   5   ASPIRIN LOW DOSE 81 MG tablet TAKE 1 TABLET BY MOUTH EVERY DAY 30 tablet 9   bisoprolol (ZEBETA) 5 MG tablet Take 1 tablet (5 mg total) by mouth daily. 90 tablet 3   Cholecalciferol (VITAMIN D) 2000 units tablet Take 2,000 Units by mouth daily.     ENBREL SURECLICK 50 MG/ML injection Inject 50 mg into the skin once a week. Takes on the weekend  2   fluticasone (FLONASE) 50 MCG/ACT nasal spray Place into the nose.     levocetirizine (XYZAL) 5 MG tablet Take 5 mg by mouth daily.     losartan (COZAAR) 25 MG tablet Take 1 tablet (25 mg total) by mouth daily. 90 tablet 3   Multiple Vitamin (MULTI-VITAMINS) TABS Take 1 tablet by mouth daily.     naproxen sodium (ALEVE) 220 MG tablet Take by mouth.     rosuvastatin (CRESTOR) 5 MG tablet TAKE 1 TABLET(5 MG) BY MOUTH DAILY 30 tablet 11   No current facility-administered medications for this visit.    Patient confirms/reports the following allergies:  Allergies   Allergen Reactions   Lisinopril     Other reaction(s): Cough, Other (See Comments)    No orders of the defined types were placed in this encounter.   AUTHORIZATION INFORMATION Primary Insurance: 1D#: Group #:  Secondary Insurance: 1D#: Group #:  SCHEDULE INFORMATION: Date: 02/08/22 Time: Location: [facility]

## 2021-12-19 LAB — CYTOLOGY - PAP
Comment: NEGATIVE
Diagnosis: NEGATIVE
High risk HPV: NEGATIVE

## 2021-12-25 DIAGNOSIS — S76012A Strain of muscle, fascia and tendon of left hip, initial encounter: Secondary | ICD-10-CM | POA: Diagnosis not present

## 2022-01-02 DIAGNOSIS — M5134 Other intervertebral disc degeneration, thoracic region: Secondary | ICD-10-CM | POA: Diagnosis not present

## 2022-01-02 DIAGNOSIS — M62830 Muscle spasm of back: Secondary | ICD-10-CM | POA: Diagnosis not present

## 2022-01-02 DIAGNOSIS — M9903 Segmental and somatic dysfunction of lumbar region: Secondary | ICD-10-CM | POA: Diagnosis not present

## 2022-01-02 DIAGNOSIS — M9902 Segmental and somatic dysfunction of thoracic region: Secondary | ICD-10-CM | POA: Diagnosis not present

## 2022-01-04 DIAGNOSIS — L905 Scar conditions and fibrosis of skin: Secondary | ICD-10-CM | POA: Diagnosis not present

## 2022-01-04 DIAGNOSIS — D0472 Carcinoma in situ of skin of left lower limb, including hip: Secondary | ICD-10-CM | POA: Diagnosis not present

## 2022-01-04 DIAGNOSIS — D0462 Carcinoma in situ of skin of left upper limb, including shoulder: Secondary | ICD-10-CM | POA: Diagnosis not present

## 2022-01-22 ENCOUNTER — Telehealth: Payer: Self-pay | Admitting: *Deleted

## 2022-01-22 NOTE — Telephone Encounter (Signed)
Patient called to reschedule colonoscopy from 02/08/2022  to 02/14/2022.  Called [facility] endo unit to make to change.  New instructions sent to patient. Patient verbalized understanding.

## 2022-01-23 ENCOUNTER — Telehealth: Payer: Self-pay | Admitting: Cardiovascular Disease

## 2022-01-23 DIAGNOSIS — M62830 Muscle spasm of back: Secondary | ICD-10-CM | POA: Diagnosis not present

## 2022-01-23 DIAGNOSIS — M9902 Segmental and somatic dysfunction of thoracic region: Secondary | ICD-10-CM | POA: Diagnosis not present

## 2022-01-23 DIAGNOSIS — M5134 Other intervertebral disc degeneration, thoracic region: Secondary | ICD-10-CM | POA: Diagnosis not present

## 2022-01-23 DIAGNOSIS — M9903 Segmental and somatic dysfunction of lumbar region: Secondary | ICD-10-CM | POA: Diagnosis not present

## 2022-01-23 NOTE — Telephone Encounter (Signed)
Called patient. She stated that the "pinchy burning" sensation started while she was lying in bed prior to sleep. She got up and took 3 tums, a xanax, and an extra dose of her Bisoprolol which she stated was advised by Dr. Rockey Situ to do if she was feeling anxious. The sensation lasted for a total of 3 hours. I offered her an appointment with Ignacia Bayley on on 02/06/22 for a follow up because of her history as noted below, and instructed patient to go to the ED if her symptoms increased, or changed. She stated that her BP has been normal and that she checks it regularly. She stated that she is very anxious and believes that it could be her anxiety. We also discussed breast changes. She stated that she is pre-menopausal and does have cystic breast tissue, she did have a recent breast exam. Patient was grateful for the call back and confirmed she will follow ED precautions, and apt. With Ignacia Bayley for 10/25.  Pt had NSTEMI and Lft Heart Cath 04/2017  Last OV note from Dr. Rockey Situ on 07/17/2021:   NSTEMI (non-ST elevated myocardial infarction) ([place])  Felt to be secondary to coronary dissection mid left circumflex nonocclusive Continue aspirin, Crestor, beta-blocker, losartan Denies recent anginal symptoms No further work-up needed, recommend she closely monitor blood pressure   Essential hypertension -  Was rushing from work, blood pressure elevated Reports typically well controlled She will measure at home and call our office with numbers   Rheumatoid arthritis, involving unspecified site, unspecified rheumatoid factor presence ([place]) Managed by primary care and rheumatology Reports symptoms stable

## 2022-01-23 NOTE — Telephone Encounter (Signed)
Patient stated she had a "pinchy" burning sensation at the top of right breast which moved to her back and lasted about 3 hours.  Patient stated she did not have chest pains or SOB.  Patient also noted she had tacos for dinner.

## 2022-02-05 NOTE — Progress Notes (Unsigned)
Cardiology Clinic Note   Patient Name: Nicole Zuniga Date of Encounter: 02/06/2022  Primary Care Provider:  [name], MD Primary Cardiologist:  Ida Rogue, MD  Patient Profile    Nicole Zuniga is a 46 year-old female with a history of NSTEMI, presumed spontaneous dissection of coronary artery in 2019, hypertension, anxiety, and RA who presents to the clinic today for complaints of chest pain.  Past Medical History    Past Medical History:  Diagnosis Date   Allergic rhinitis    Anxiety    Basal cell carcinoma 2006   left superior lateral chest at breast   Essential hypertension    History of echocardiogram    a. 04/2017 Echo: EF 60-65%, no rwma, nl RV fxn.   Non-ST elevation (NSTEMI) myocardial infarction ([place])    a. 04/2017 presumed SCAD.   Panic attack    RA (rheumatoid arthritis) (HCC)    Spontaneous dissection of coronary artery    a. 04/2017 NSTEMI/Cath: LM nl, LAD nl, D1-3 nl, LCX 64m OM1-3 nl, RCA nl, RPDA/RPLB1-2 nl. EF 55-65%.   Past Surgical History:  Procedure Laterality Date   [facility]    LEFT HEART CATH AND CORONARY ANGIOGRAPHY N/A 05/02/2017   Procedure: LEFT HEART CATH AND CORONARY ANGIOGRAPHY;  Surgeon: AWellington Hampshire MD;  [facility];  Service: Cardiovascular;  Laterality: N/A;   NECK SURGERY     Excision of lymph node    Allergies  Allergies  Allergen Reactions   Lisinopril     Other reaction(s): Cough, Other (See Comments)    History of Present Illness    Nicole Zuniga has a past medical history of: CAD status post NSTEMI. Left heart catheterization 05/02/2017: Mid circumflex lesion 50% stenosed, LVEDP mildly elevated. Echo 05/01/2017: EF 60 to 65%. Hypertension. Currently taking losartan 25 mg and bisoprolol 5 mg. Anxiety. Currently taking Xanax 0.25 mg as needed.  She has been unable to tolerate SSRIs/SNRIs. RA. Currently taking Enbrel.  Patient was last seen  on 07/17/2021 by Dr. GRockey Situ  Her medications were continued at that time and she was told to follow-up in 1 year.  Today, patient reports 2 weeks ago she woke in the middle of the night with a pinching/burning sensation above her right breast that went through to her back.  She attempted taking Tums, Xanax, and an extra bisoprolol without relief.  The pain lasted 3 to 4 hours before she was able to get back to sleep.  She she awoke in the morning pain-free and was able to do her normal activities throughout the day including painting a door.  After that episode of pain she was seen by her chiropractor who felt pain could be coming from nerves in her rib cage.  The pain has not returned since, however, the day after treatment from her chiropractor she experienced some numbness on the right side of her back and under her right arm.  This is slowly getting back to normal.  Patient has participated in all of her normal activities for the last 2 weeks.  She exercises doing combination strength training and cardio 3 days a week as well as walks on her lunch hour 2 to 3 days a week.  She denies any chest pain or shortness of breath with activity.  She recently intentionally lost 20 pounds by watching her carb and sugar intake and eating small meals throughout the day.  Patient describes herself as a very anxious person.  She reports that she is under a lot of stress at her job and at home.  She manages her anxiety with Xanax.  She has been unable to tolerate other medications such as SSRIs/SNRIs for her anxiety.  Her BP is elevated today.  She states it always is in the doctor's office.  Home BP is normally 120s to 130s over 70s to 80s.   Home Medications    Current Meds  Medication Sig   ALPRAZolam (XANAX) 0.25 MG tablet Take 0.25 mg by mouth at bedtime as needed for anxiety.    ASPIRIN LOW DOSE 81 MG tablet TAKE 1 TABLET BY MOUTH EVERY DAY   bisoprolol (ZEBETA) 5 MG tablet Take 1 tablet (5 mg total) by mouth  daily.   Cholecalciferol (VITAMIN D) 2000 units tablet Take 2,000 Units by mouth daily.   ENBREL SURECLICK 50 MG/ML injection Inject 50 mg into the skin once a week. Takes on the weekend   fluticasone (FLONASE) 50 MCG/ACT nasal spray Place into the nose.   levocetirizine (XYZAL) 5 MG tablet Take 5 mg by mouth daily.   losartan (COZAAR) 25 MG tablet Take 1 tablet (25 mg total) by mouth daily.   metoprolol tartrate (LOPRESSOR) 50 MG tablet Take one tablet (50 mg) by mouth 2 hours prior to your Cardiac CT.   Multiple Vitamin (MULTI-VITAMINS) TABS Take 1 tablet by mouth daily.   naproxen sodium (ALEVE) 220 MG tablet Take by mouth.   rosuvastatin (CRESTOR) 5 MG tablet TAKE 1 TABLET(5 MG) BY MOUTH DAILY    Family History    Family History  Problem Relation Age of Onset   Hypertension Mother    Hyperlipidemia Mother    Diabetes Mother    CAD Father        s/p CABG in his late 61's   Hypertension Father    Hyperlipidemia Father    Congestive Heart Failure Father    Diabetes Sister    Leukemia Paternal Grandfather    She indicated that her mother is alive. She indicated that her father is alive. She indicated that her sister is alive. She indicated that her paternal grandfather is deceased.   Social History    Social History   Socioeconomic History   Marital status: Married    Spouse name: Mallie Mussel   Number of children: 3   Years of education: Not on file   Highest education level: Not on file  Occupational History   Occupation: Landscape architect  Tobacco Use   Smoking status: Never   Smokeless tobacco: Never  Vaping Use   Vaping Use: Never used  Substance and Sexual Activity   Alcohol use: Yes    Comment: rare   Drug use: No   Sexual activity: Yes    Partners: Male    Birth control/protection: Other-see comments    Comment: Vasectomy  Other Topics Concern   Not on file  Social History Narrative   Lives locally with her husband and three children (ages 15, 60, 36).  Works  as Landscape architect.  Exercises regularly.   Social Determinants of Health   Financial Resource Strain: Not on file  Food Insecurity: Not on file  Transportation Needs: Not on file  Physical Activity: Insufficiently Active (05/06/2017)   Exercise Vital Sign    Days of Exercise per Week: 4 days    Minutes of Exercise per Session: 30 min  Stress: Stress Concern Present (05/06/2017)   [employer]  of Occupational Health - Occupational Stress Questionnaire    Feeling of Stress : To some extent  Social Connections: Socially Integrated (05/06/2017)   Social Connection and Isolation Panel [NHANES]    Frequency of Communication with Friends and Family: More than three times a week    Frequency of Social Gatherings with Friends and Family: Once a week    Attends Religious Services: More than 4 times per year    Active Member of [employer] or Organizations: Yes    Attends Music therapist: More than 4 times per year    Marital Status: Married  Human resources officer Violence: Not At Risk (05/06/2017)   Humiliation, Afraid, Rape, and Kick questionnaire    Fear of Current or Ex-Partner: No    Emotionally Abused: No    Physically Abused: No    Sexually Abused: No     Review of Systems    General: No chills, fever, night sweats or weight changes.  Cardiovascular:  No chest pain, dyspnea on exertion, edema, orthopnea, palpitations, paroxysmal nocturnal dyspnea. Dermatological: No rash, lesions/masses Respiratory: No cough, dyspnea Urologic: No hematuria, dysuria Abdominal:   No nausea, vomiting, diarrhea, bright red blood per rectum, melena, or hematemesis Neurologic:  No visual changes, weakness, changes in mental status. All other systems reviewed and are otherwise negative except as noted above.  Physical Exam    VS:  BP (!) 160/96 (BP Location: Left Arm, Patient Position: Sitting, Cuff Size: Normal)   Pulse 61   Ht 5' 9.5" (1.765 m)   Wt 185 lb 6.4 oz (84.1 kg)   SpO2 100%    BMI 26.99 kg/m  , BMI Body mass index is 26.99 kg/m. GEN: Well nourished, well developed, in no acute distress. HEENT: Normal. Neck: Supple, no JVD, carotid bruits, or masses. Cardiac: RRR, no murmurs, rubs, or gallops. No clubbing, cyanosis, edema.  Radials/DP/PT 2+ and equal bilaterally.  Respiratory:  Respirations regular and unlabored, clear to auscultation bilaterally. GI: Soft, nontender, nondistended. MS: No deformity or atrophy. Skin: Warm and dry, no rash. Neuro:  Strength and sensation are intact. Psych: Normal affect.  Accessory Clinical Findings    The following studies were reviewed for this visit: Left heart catheterization 05/02/2017: Mid Cx lesion is 50% stenosed. The left ventricular systolic function is normal. LV end diastolic pressure is mildly elevated. The left ventricular ejection fraction is 55-65% by visual estimate.   1.  Moderate one-vessel coronary artery disease with 50% stenosis in the mid left circumflex that has an appearance that is suggestive but not conclusive of spontaneous coronary artery dissection. 2.  Normal LV systolic function and mildly elevated left ventricular end-diastolic pressure.   Recommendations: Low-dose aspirin and a beta-blocker.  Echo 05/01/2017: Study Conclusions   - Left ventricle: The cavity size was normal. Systolic function was    normal. The estimated ejection fraction was in the range of 60%    to 65%. Wall motion was normal; there were no regional wall    motion abnormalities. Left ventricular diastolic function    parameters were normal.  - Left atrium: The atrium was normal in size.  - Right ventricle: Systolic function was normal.  - Pulmonary arteries: Systolic pressure was within the normal    range.   Recent Labs: CMP 07/26/2021 from outside facility: Sodium 143, potassium 4.2, BUN 16, creatinine 0.8, AST 13, ALT 23 CBC 07/26/2021 from outside facility: Hemoglobin 13.3, hematocrit 42, platelet 304  Recent  Lipid Panel Lipid panel 07/26/2021 from outside facility: LDL  75, HDL 48.3, triglycerides 103, total cholesterol 144.  HYPERTENSION CONTROL Vitals:   02/06/22 1112 02/06/22 1148  BP: (!) 160/90 (!) 160/96    The patient's blood pressure is elevated above target today.  In order to address the patient's elevated BP: The blood pressure is usually elevated in clinic.  Blood pressures monitored at home have been optimal.     ECG personally reviewed by me today normal sinus rhythm, heart rate 61. Unchanged from 07/17/2021.    Assessment & Plan   CAD status post NSTEMI 2019.  Presumed SCAD.  Patient reports pinching burning sensation above right breast going to back that woke her from sleep 2 weeks ago.  Pain did not resolve with Tums, Xanax, extra dose of beta-blocker.  She has not had repeat pain for the last 2 weeks.  She has been able to participate in normal activities such as cardio/strength training exercise 3 days a week and walking 2 to 3 days a week without chest pain or shortness of breath.  Reassured patient the low likelihood her isolated episode of right-sided chest pain was related to her heart. Offered coronary CTA, as patient has a high level of anxiety and would like further evaluation.  Hypertension.  BP 160/90 at initial intake.  Repeat 160/96.  Patient reports her blood pressure is typically high at the doctor's office.  She does check her blood pressure at home and normally gets readings of 120s to 130s over 70s to 80s.  Continue losartan 25 mg daily and bisoprolol 5 mg daily.   Disposition: BMP today.  Coronary CTA.  Metoprolol 50 mg x 1 dose on day of CT.  Return in 6 to 8 weeks to review test or sooner as needed.   Justice Britain. Leitha Hyppolite, NP-C     02/06/2022, 12:17 PM [place] [address] Office [phone] Fax [phone]   I spent 13 minutes examining this patient, reviewing medications, and using patient centered  shared decision making involving her cardiac care.  Prior to her visit I spent greater than 20 minutes reviewing her past medical history,  medications, and prior cardiac tests.

## 2022-02-06 ENCOUNTER — Other Ambulatory Visit
Admission: RE | Admit: 2022-02-06 | Discharge: 2022-02-06 | Disposition: A | Discharge status: Home / Self Care | Payer: BC Managed Care – PPO | Source: Ambulatory Visit | Attending: Nurse Practitioner | Admitting: Nurse Practitioner

## 2022-02-06 ENCOUNTER — Encounter: Payer: Self-pay | Admitting: Nurse Practitioner

## 2022-02-06 ENCOUNTER — Ambulatory Visit: Payer: BC Managed Care – PPO | Attending: Nurse Practitioner | Admitting: Student

## 2022-02-06 VITALS — BP 160/96 | HR 61 | Ht 69.5 in | Wt 185.4 lb

## 2022-02-06 DIAGNOSIS — I10 Essential (primary) hypertension: Secondary | ICD-10-CM | POA: Insufficient documentation

## 2022-02-06 DIAGNOSIS — I25118 Atherosclerotic heart disease of native coronary artery with other forms of angina pectoris: Secondary | ICD-10-CM | POA: Insufficient documentation

## 2022-02-06 DIAGNOSIS — I214 Non-ST elevation (NSTEMI) myocardial infarction: Secondary | ICD-10-CM

## 2022-02-06 DIAGNOSIS — Z79899 Other long term (current) drug therapy: Secondary | ICD-10-CM

## 2022-02-06 LAB — BASIC METABOLIC PANEL
Anion gap: 6 (ref 5–15)
BUN: 25 mg/dL — ABNORMAL HIGH (ref 6–20)
CO2: 29 mmol/L (ref 22–32)
Calcium: 9.6 mg/dL (ref 8.9–10.3)
Chloride: 105 mmol/L (ref 98–111)
Creatinine, Ser: 0.83 mg/dL (ref 0.44–1.00)
GFR, Estimated: >60 mL/min (ref >60)
Glucose, Bld: 108 mg/dL — ABNORMAL HIGH (ref 70–99)
Potassium: 4.2 mmol/L (ref 3.5–5.1)
Sodium: 140 mmol/L (ref 135–145)

## 2022-02-06 MED ORDER — METOPROLOL TARTRATE 50 MG PO TABS: ORAL_TABLET | ORAL | 0 refills | Status: DC

## 2022-02-06 NOTE — Patient Instructions (Addendum)
Medication Instructions:   *If you need a refill on your cardiac medications before your next appointment, please call your pharmacy*   Lab Work: Bmet today   If you have labs (blood work) drawn today and your tests are completely normal, you will receive your results only by: [place] (if you have MyChart) OR A paper copy in the mail If you have any lab test that is abnormal or we need to change your treatment, we will call you to review the results.   Testing/Procedures:   Your cardiac CT will be scheduled at one of the below locations:   [facility] [address] [place], [place] [address] (336) [place] [address] [place], [place] [address] [phone]  [facility] [place], [place] [address] [phone]  If scheduled at [facility], please arrive at the [facility] Entrance (Entrance C2) of [facility] 30 minutes prior to test start time. You can use the FREE valet parking offered at entrance C (encouraged to control the heart rate for the test)  Proceed to the [facility] Radiology Department (first floor) to check-in and test prep.  All radiology patients and guests should use entrance C2 at [facility], accessed from [facility], even though the hospital's physical address listed is [address]    If scheduled at [facility] or [facility], please arrive 15 mins early for check-in and test prep.   Please follow these instructions carefully (unless otherwise directed):   On the Night Before the Test: Be sure to Drink plenty of water. Do not consume any caffeinated/decaffeinated beverages or chocolate 12 hours prior to your test. Do not take any antihistamines 12 hours prior to your test.  On the Day of the Test: Drink  plenty of water until 1 hour prior to the test. Do not eat any food 1 hour prior to test. You may take your regular medications prior to the test.  Take metoprolol (Lopressor) two hours prior to test. FEMALES- please wear underwire-free bra if available, avoid dresses & tight clothing       After the Test: Drink plenty of water. After receiving IV contrast, you may experience a mild flushed feeling. This is normal. On occasion, you may experience a mild rash up to 24 hours after the test. This is not dangerous. If this occurs, you can take Benadryl 25 mg and increase your fluid intake. If you experience trouble breathing, this can be serious. If it is severe call [phone] IMMEDIATELY. If it is mild, please call our office.  We will call to schedule your test 2-4 weeks out understanding that some insurance companies will need an authorization prior to the service being performed.   For non-scheduling related questions, please contact the cardiac imaging nurse navigator should you have any questions/concerns: Marchia Bond, Cardiac Imaging Nurse Navigator Gordy Clement, Cardiac Imaging Nurse Navigator [facility] Direct Office Dial: [phone]   For scheduling needs, including cancellations and rescheduling, please call [place], [phone].    Follow-Up: At [facility], you and your health needs are our priority.  As part of our continuing mission to provide you with exceptional heart care, we have created designated Provider Care Teams.  These Care Teams include your primary Cardiologist (physician) and Advanced Practice Providers (APPs -  Physician Assistants  and Nurse Practitioners) who all work together to provide you with the care you need, when you need it.  We recommend signing up for the patient portal called "MyChart".  Sign up information is provided on this After Visit Summary.  MyChart is used to connect with patients for Virtual Visits  (Telemedicine).  Patients are able to view lab/test results, encounter notes, upcoming appointments, etc.  Non-urgent messages can be sent to your provider as well.   To learn more about what you can do with MyChart, go to [email or web address].    Your next appointment:   6- 8 week(s)  The format for your next appointment:   In Person  Provider:   Murray Hodgkins, NP    Other Instructions   Important Information About Sugar

## 2022-02-07 ENCOUNTER — Encounter: Payer: Self-pay | Admitting: Obstetrics and Gynecology

## 2022-02-08 ENCOUNTER — Encounter: Payer: Self-pay | Admitting: Cardiovascular Disease

## 2022-02-13 ENCOUNTER — Encounter: Payer: Self-pay | Admitting: Gastroenterology

## 2022-02-13 DIAGNOSIS — M62830 Muscle spasm of back: Secondary | ICD-10-CM | POA: Diagnosis not present

## 2022-02-13 DIAGNOSIS — M9902 Segmental and somatic dysfunction of thoracic region: Secondary | ICD-10-CM | POA: Diagnosis not present

## 2022-02-13 DIAGNOSIS — M9903 Segmental and somatic dysfunction of lumbar region: Secondary | ICD-10-CM | POA: Diagnosis not present

## 2022-02-13 DIAGNOSIS — M5134 Other intervertebral disc degeneration, thoracic region: Secondary | ICD-10-CM | POA: Diagnosis not present

## 2022-02-14 ENCOUNTER — Ambulatory Visit: Payer: BC Managed Care – PPO | Admitting: Anesthesiology

## 2022-02-14 ENCOUNTER — Encounter: Payer: Self-pay | Admitting: Gastroenterology

## 2022-02-14 ENCOUNTER — Ambulatory Visit
Admission: RE | Admit: 2022-02-14 | Discharge: 2022-02-14 | Disposition: A | Discharge status: Home / Self Care | Payer: BC Managed Care – PPO | Attending: Gastroenterology | Admitting: Gastroenterology

## 2022-02-14 ENCOUNTER — Encounter
Admission: RE | Disposition: A | Discharge status: Home / Self Care | Payer: Self-pay | Source: Home / Self Care | Attending: Gastroenterology

## 2022-02-14 DIAGNOSIS — Z1211 Encounter for screening for malignant neoplasm of colon: Secondary | ICD-10-CM | POA: Diagnosis not present

## 2022-02-14 DIAGNOSIS — K5730 Diverticulosis of large intestine without perforation or abscess without bleeding: Secondary | ICD-10-CM | POA: Diagnosis not present

## 2022-02-14 DIAGNOSIS — I252 Old myocardial infarction: Secondary | ICD-10-CM | POA: Diagnosis not present

## 2022-02-14 DIAGNOSIS — Z85828 Personal history of other malignant neoplasm of skin: Secondary | ICD-10-CM | POA: Insufficient documentation

## 2022-02-14 DIAGNOSIS — J309 Allergic rhinitis, unspecified: Secondary | ICD-10-CM | POA: Diagnosis not present

## 2022-02-14 DIAGNOSIS — Z79899 Other long term (current) drug therapy: Secondary | ICD-10-CM | POA: Insufficient documentation

## 2022-02-14 DIAGNOSIS — F419 Anxiety disorder, unspecified: Secondary | ICD-10-CM | POA: Diagnosis not present

## 2022-02-14 DIAGNOSIS — I10 Essential (primary) hypertension: Secondary | ICD-10-CM | POA: Insufficient documentation

## 2022-02-14 DIAGNOSIS — F410 Panic disorder [episodic paroxysmal anxiety] without agoraphobia: Secondary | ICD-10-CM | POA: Insufficient documentation

## 2022-02-14 DIAGNOSIS — K640 First degree hemorrhoids: Secondary | ICD-10-CM | POA: Diagnosis not present

## 2022-02-14 DIAGNOSIS — I2510 Atherosclerotic heart disease of native coronary artery without angina pectoris: Secondary | ICD-10-CM | POA: Diagnosis not present

## 2022-02-14 DIAGNOSIS — K649 Unspecified hemorrhoids: Secondary | ICD-10-CM | POA: Diagnosis not present

## 2022-02-14 DIAGNOSIS — M069 Rheumatoid arthritis, unspecified: Secondary | ICD-10-CM | POA: Diagnosis not present

## 2022-02-14 HISTORY — PX: COLONOSCOPY WITH PROPOFOL: SHX5780

## 2022-02-14 SURGERY — COLONOSCOPY WITH PROPOFOL
Anesthesia: General

## 2022-02-14 MED ORDER — LIDOCAINE HCL (CARDIAC) PF 100 MG/5ML IV SOSY
PREFILLED_SYRINGE | INTRAVENOUS | Status: DC | PRN
  Administered 2022-02-14: 50 mg via INTRAVENOUS

## 2022-02-14 MED ORDER — MIDAZOLAM HCL 2 MG/2ML IJ SOLN
INTRAMUSCULAR | Status: DC | PRN
  Administered 2022-02-14: 2 mg via INTRAVENOUS

## 2022-02-14 MED ORDER — PROPOFOL 10 MG/ML IV BOLUS
INTRAVENOUS | Status: DC | PRN
  Administered 2022-02-14: 60 mg via INTRAVENOUS

## 2022-02-14 MED ORDER — PROPOFOL 500 MG/50ML IV EMUL
INTRAVENOUS | Status: DC | PRN
  Administered 2022-02-14: 150 ug/kg/min via INTRAVENOUS

## 2022-02-14 MED ORDER — MIDAZOLAM HCL 2 MG/2ML IJ SOLN
INTRAMUSCULAR | Status: AC
  Filled 2022-02-14: qty 2

## 2022-02-14 MED ORDER — PROPOFOL 1000 MG/100ML IV EMUL
INTRAVENOUS | Status: AC
  Filled 2022-02-14: qty 100

## 2022-02-14 MED ORDER — SODIUM CHLORIDE 0.9 % IV SOLN: INTRAVENOUS | Status: DC

## 2022-02-14 NOTE — Anesthesia Preprocedure Evaluation (Addendum)
Anesthesia Evaluation  Patient identified by MRN, date of birth, ID band Patient awake    Reviewed: Allergy & Precautions, NPO status , Patient's Chart, lab work & pertinent test results  Airway Mallampati: III  TM Distance: >3 FB Neck ROM: full    Dental  (+) Chipped   Pulmonary neg pulmonary ROS   Pulmonary exam normal        Cardiovascular Exercise Tolerance: Good hypertension, Pt. on medications + Past MI  Normal cardiovascular exam  Spontaneous dissection of coronary artery  Shows normal sinus rhythm rate 60 bpm nonspecific ST-T wave abnormality   Other past medical history reviewed  Cardiac catheterization performed 05/02/2017  Mid Cx lesion is 50% stenosed.  The left ventricular systolic function is normal.  LV end diastolic pressure is mildly elevated.  The left ventricular ejection fraction is 55-65% by visual estimate.   1.  Moderate one-vessel coronary artery disease with 50% stenosis in the mid left circumflex that has an appearance that is suggestive but not conclusive of spontaneous coronary artery dissection. 2.  Normal LV systolic function and mildly elevated left ventricular end-diastolic pressure.    Neuro/Psych  PSYCHIATRIC DISORDERS Anxiety     negative neurological ROS     GI/Hepatic negative GI ROS, Neg liver ROS,,,  Endo/Other  negative endocrine ROS    Renal/GU negative Renal ROS  negative genitourinary   Musculoskeletal  (+) Arthritis , Rheumatoid disorders,    Abdominal   Peds  Hematology negative hematology ROS (+)   Anesthesia Other Findings Past Medical History: No date: Allergic rhinitis No date: Anxiety 2006: Basal cell carcinoma     Comment:  left superior lateral chest at breast No date: Essential hypertension No date: History of echocardiogram     Comment:  a. 04/2017 Echo: EF 60-65%, no rwma, nl RV fxn. No date: Non-ST elevation (NSTEMI) myocardial infarction [facility])      Comment:  a. 04/2017 presumed SCAD. No date: Panic attack No date: RA (rheumatoid arthritis) ([place]) No date: Spontaneous dissection of coronary artery     Comment:  a. 04/2017 NSTEMI/Cath: LM nl, LAD nl, D1-3 nl, LCX 9m               OM1-3 nl, RCA nl, RPDA/RPLB1-2 nl. EF 55-65%.  Past Surgical History: 07/2010: HERNIA REPAIR     Comment:  [facility] No date: KNEE SURGERY; Left 05/02/2017: LEFT HEART CATH AND CORONARY ANGIOGRAPHY; N/A     Comment:  Procedure: LEFT HEART CATH AND CORONARY ANGIOGRAPHY;                Surgeon: AWellington Hampshire MD;  [facility];  Service: Cardiovascular;  Laterality: N/A; No date: NECK SURGERY     Comment:  Excision of lymph node     Reproductive/Obstetrics negative OB ROS                              Anesthesia Physical Anesthesia Plan  ASA: 2  Anesthesia Plan: General   Post-op Pain Management: Minimal or no pain anticipated   Induction: Intravenous  PONV Risk Score and Plan: Propofol infusion and TIVA  Airway Management Planned: Natural Airway  Additional Equipment:   Intra-op Plan:   Post-operative Plan:   Informed Consent: I have reviewed the patients History and Physical, chart, labs and discussed the procedure including the risks, benefits and  alternatives for the proposed anesthesia with the patient or authorized representative who has indicated his/her understanding and acceptance.     Dental Advisory Given  Plan Discussed with: Anesthesiologist, CRNA and Surgeon  Anesthesia Plan Comments:          Anesthesia Quick Evaluation

## 2022-02-14 NOTE — Transfer of Care (Signed)
Immediate Anesthesia Transfer of Care Note  Patient: Nicole Zuniga  Procedure(s) Performed: COLONOSCOPY WITH PROPOFOL  Patient Location: PACU and Endoscopy Unit  Anesthesia Type:General  Level of Consciousness: awake  Airway & Oxygen Therapy: Patient Spontanous Breathing  Post-op Assessment: Report given to RN and Post -op Vital signs reviewed and stable  Post vital signs: Reviewed and stable  Last Vitals:  Vitals Value Taken Time  BP 128/72 02/14/22 0948  Temp 35.6 C 02/14/22 0948  Pulse 64 02/14/22 0949  Resp 10 02/14/22 0949  SpO2 100 % 02/14/22 0949  Vitals shown include unvalidated device data.  Last Pain:  Vitals:   02/14/22 0948  TempSrc: Temporal  PainSc: 0-No pain         Complications: No notable events documented.

## 2022-02-14 NOTE — H&P (Signed)
Jonathon Bellows, MD [address], [place], [place], [place], [address] [address], [place], [place], [place], [address] Phone: [phone]  Fax: [phone]  Primary Care Physician:  [name], MD   Pre-Procedure History & Physical: HPI:  JENNETT TARBELL is a 46 y.o. female is here for an colonoscopy.   Past Medical History:  Diagnosis Date   Allergic rhinitis    Anxiety    Basal cell carcinoma 2006   left superior lateral chest at breast   Essential hypertension    History of echocardiogram    a. 04/2017 Echo: EF 60-65%, no rwma, nl RV fxn.   Non-ST elevation (NSTEMI) myocardial infarction ([place])    a. 04/2017 presumed SCAD.   Panic attack    RA (rheumatoid arthritis) (HCC)    Spontaneous dissection of coronary artery    a. 04/2017 NSTEMI/Cath: LM nl, LAD nl, D1-3 nl, LCX 75m OM1-3 nl, RCA nl, RPDA/RPLB1-2 nl. EF 55-65%.    Past Surgical History:  Procedure Laterality Date   [facility]    LEFT HEART CATH AND CORONARY ANGIOGRAPHY N/A 05/02/2017   Procedure: LEFT HEART CATH AND CORONARY ANGIOGRAPHY;  Surgeon: AWellington Hampshire MD;  [facility];  Service: Cardiovascular;  Laterality: N/A;   NECK SURGERY     Excision of lymph node    Prior to Admission medications   Medication Sig Start Date End Date Taking? Authorizing Provider  ASPIRIN LOW DOSE 81 MG tablet TAKE 1 TABLET BY MOUTH EVERY DAY 10/05/21  Yes Gollan, TKathlene November MD  bisoprolol (ZEBETA) 5 MG tablet Take 1 tablet (5 mg total) by mouth daily. 07/17/21  Yes GMinna Merritts MD  Cholecalciferol (VITAMIN D) 2000 units tablet Take 2,000 Units by mouth daily.   Yes [provider]  levocetirizine (XYZAL) 5 MG tablet Take 5 mg by mouth daily. 03/21/16  Yes [provider]  losartan (COZAAR) 25 MG tablet Take 1 tablet (25 mg total) by mouth daily. 07/17/21  Yes GMinna Merritts MD  Multiple Vitamin (MULTI-VITAMINS) TABS Take 1 tablet  by mouth daily. 03/03/07  Yes [provider]  rosuvastatin (CRESTOR) 5 MG tablet TAKE 1 TABLET(5 MG) BY MOUTH DAILY 07/17/21  Yes Gollan, TKathlene November MD  ALPRAZolam (Duanne Moron 0.25 MG tablet Take 0.25 mg by mouth at bedtime as needed for anxiety.  04/23/17   [provider]  ENBREL SURECLICK 50 MG/ML injection Inject 50 mg into the skin once a week. Takes on the weekend 04/26/17   [provider]  fluticasone (FLONASE) 50 MCG/ACT nasal spray Place into the nose. 01/12/20   [provider]  metoprolol tartrate (LOPRESSOR) 50 MG tablet Take one tablet (50 mg) by mouth 2 hours prior to your Cardiac CT. 02/06/22   WMayra Reel NP  naproxen sodium (ALEVE) 220 MG tablet Take by mouth.    [provider]    Allergies as of 12/18/2021 - Review Complete 12/18/2021  Allergen Reaction Noted   Lisinopril  09/10/2012    Family History  Problem Relation Age of Onset   Hypertension Mother    Hyperlipidemia Mother    Diabetes Mother    CAD Father        s/p CABG in his late 519's  Hypertension Father    Hyperlipidemia Father    Congestive Heart Failure Father    Diabetes Sister    Leukemia Paternal GMerchant navy officer  Social History   Socioeconomic History   Marital status: Married    Spouse name: Mallie Mussel   Number of children: 3   Years of education: Not on file   Highest education level: Not on file  Occupational History   Occupation: Landscape architect  Tobacco Use   Smoking status: Never   Smokeless tobacco: Never  Vaping Use   Vaping Use: Never used  Substance and Sexual Activity   Alcohol use: Yes    Comment: rare   Drug use: No   Sexual activity: Yes    Partners: Male    Birth control/protection: Other-see comments    Comment: Vasectomy  Other Topics Concern   Not on file  Social History Narrative   Lives locally with her husband and three children (ages 3, 33, 60).  Works as Landscape architect.  Exercises regularly.   Social Determinants  of Health   Financial Resource Strain: Not on file  Food Insecurity: Not on file  Transportation Needs: Not on file  Physical Activity: Insufficiently Active (05/06/2017)   Exercise Vital Sign    Days of Exercise per Week: 4 days    Minutes of Exercise per Session: 30 min  Stress: Stress Concern Present (05/06/2017)   [place]    Feeling of Stress : To some extent  Social Connections: Socially Integrated (05/06/2017)   Social Connection and Isolation Panel [NHANES]    Frequency of Communication with Friends and Family: More than three times a week    Frequency of Social Gatherings with Friends and Family: Once a week    Attends Religious Services: More than 4 times per year    Active Member of [employer] or Organizations: Yes    Attends Music therapist: More than 4 times per year    Marital Status: Married  Human resources officer Violence: Not At Risk (05/06/2017)   Humiliation, Afraid, Rape, and Kick questionnaire    Fear of Current or Ex-Partner: No    Emotionally Abused: No    Physically Abused: No    Sexually Abused: No    Review of Systems: See HPI, otherwise negative ROS  Physical Exam: There were no vitals taken for this visit. General:   Alert,  pleasant and cooperative in NAD Head:  Normocephalic and atraumatic. Neck:  Supple; no masses or thyromegaly. Lungs:  Clear throughout to auscultation, normal respiratory effort.    Heart:  +S1, +S2, Regular rate and rhythm, No edema. Abdomen:  Soft, nontender and nondistended. Normal bowel sounds, without guarding, and without rebound.   Neurologic:  Alert and  oriented x4;  grossly normal neurologically.  Impression/Plan: Merari D Strupp is here for an colonoscopy to be performed for Screening colonoscopy average risk   Risks, benefits, limitations, and alternatives regarding  colonoscopy have been reviewed with the patient.  Questions have been  answered.  All parties agreeable.   Jonathon Bellows, MD  02/14/2022, 8:51 AM

## 2022-02-14 NOTE — Op Note (Signed)
[facility] Gastroenterology Patient Name: Nicole Zuniga Procedure Date: 02/14/2022 9:20 AM MRN: [id number] Account #: [id number] Date of Birth: Sep 09, 1975 Admit Type: Outpatient Age: 46 Room: [facility] ENDO ROOM 3 Gender: Female Note Status: Finalized Instrument Name: Jasper Riling [id number] Procedure:             Colonoscopy Indications:           Screening for colorectal malignant neoplasm Providers:             Jonathon Bellows MD, MD Referring MD:          [name], MD (Referring MD) Medicines:             Monitored Anesthesia Care Complications:         No immediate complications. Procedure:             Pre-Anesthesia Assessment:                        - Prior to the procedure, a History and Physical was                         performed, and patient medications, allergies and                         sensitivities were reviewed. The patient's tolerance                         of previous anesthesia was reviewed.                        - The risks and benefits of the procedure and the                         sedation options and risks were discussed with the                         patient. All questions were answered and informed                         consent was obtained.                        - ASA Grade Assessment: II - A patient with mild                         systemic disease.                        After obtaining informed consent, the colonoscope was                         passed under direct vision. Throughout the procedure,                         the patient's blood pressure, pulse, and oxygen                         saturations were monitored continuously. The                         Colonoscope was  introduced through the anus and                         advanced to the the cecum, identified by the                         appendiceal orifice. The colonoscopy was performed                         without difficulty. The patient tolerated the                          procedure well. The quality of the bowel preparation                         was good. The appendiceal orifice was photographed. Findings:      The perianal and digital rectal examinations were normal.      Multiple large-mouthed and small-mouthed diverticula were found in the       sigmoid colon.      Non-bleeding internal hemorrhoids were found during retroflexion. The       hemorrhoids were large and Grade I (internal hemorrhoids that do not       prolapse).      The exam was otherwise without abnormality on direct and retroflexion       views. Impression:            - Diverticulosis in the sigmoid colon.                        - Non-bleeding internal hemorrhoids.                        - The examination was otherwise normal on direct and                         retroflexion views.                        - No specimens collected. Recommendation:        - Discharge patient to home (with escort).                        - Resume previous diet.                        - Continue present medications.                        - Repeat colonoscopy in 10 years for screening                         purposes. Procedure Code(s):     --- Professional ---                        [phone], Colonoscopy, flexible; diagnostic, including                         collection of specimen(s) by brushing or washing, when  performed (separate procedure) Diagnosis Code(s):     --- Professional ---                        Z12.11, Encounter for screening for malignant neoplasm                         of colon                        K64.0, First degree hemorrhoids                        K57.30, Diverticulosis of large intestine without                         perforation or abscess without bleeding CPT copyright 2022 American Medical Association. All rights reserved. The codes documented in this report are preliminary and upon coder review may  be revised to meet current  compliance requirements. Jonathon Bellows, MD Jonathon Bellows MD, MD 02/14/2022 9:46:52 AM This report has been signed electronically. Number of Addenda: 0 Note Initiated On: 02/14/2022 9:20 AM Scope Withdrawal Time: 0 hours 9 minutes 34 seconds  Total Procedure Duration: 0 hours 12 minutes 55 seconds  Estimated Blood Loss:  Estimated blood loss: none.      [facility]

## 2022-02-14 NOTE — Anesthesia Postprocedure Evaluation (Signed)
Anesthesia Post Note  Patient: Nicole Zuniga  Procedure(s) Performed: COLONOSCOPY WITH PROPOFOL  Patient location during evaluation: PACU Anesthesia Type: General Level of consciousness: awake and alert Pain management: pain level controlled Vital Signs Assessment: post-procedure vital signs reviewed and stable Respiratory status: spontaneous breathing, nonlabored ventilation and respiratory function stable Cardiovascular status: blood pressure returned to baseline and stable Postop Assessment: no apparent nausea or vomiting Anesthetic complications: no   No notable events documented.   Last Vitals:  Vitals:   02/14/22 0958 02/14/22 1008  BP: (!) 147/82 (!) 158/90  Pulse: 63 (!) 56  Resp: 19 12  Temp:    SpO2: 100% 100%    Last Pain:  Vitals:   02/14/22 1008  TempSrc:   PainSc: 0-No pain                 [place] Ouch

## 2022-02-15 ENCOUNTER — Encounter: Payer: Self-pay | Admitting: Gastroenterology

## 2022-02-27 ENCOUNTER — Telehealth (HOSPITAL_COMMUNITY): Payer: Self-pay | Admitting: *Deleted

## 2022-02-27 NOTE — Telephone Encounter (Signed)
Attempted to call patient regarding upcoming cardiac CT appointment. °Left message on voicemail with name and callback number ° °Jynesis Nakamura RN Navigator Cardiac Imaging °[facility] °[phone] Office °[phone] Cell ° °

## 2022-02-28 ENCOUNTER — Ambulatory Visit
Admission: RE | Admit: 2022-02-28 | Discharge: 2022-02-28 | Disposition: A | Discharge status: Home / Self Care | Payer: BC Managed Care – PPO | Source: Ambulatory Visit | Attending: Student | Admitting: Student

## 2022-02-28 DIAGNOSIS — I25118 Atherosclerotic heart disease of native coronary artery with other forms of angina pectoris: Secondary | ICD-10-CM | POA: Diagnosis not present

## 2022-02-28 DIAGNOSIS — Z79899 Other long term (current) drug therapy: Secondary | ICD-10-CM | POA: Diagnosis not present

## 2022-02-28 DIAGNOSIS — I10 Essential (primary) hypertension: Secondary | ICD-10-CM | POA: Insufficient documentation

## 2022-02-28 DIAGNOSIS — R079 Chest pain, unspecified: Secondary | ICD-10-CM | POA: Diagnosis not present

## 2022-02-28 MED ORDER — METOPROLOL TARTRATE 5 MG/5ML IV SOLN
10.0000 mg | Freq: Once | INTRAVENOUS | Status: AC
  Administered 2022-02-28: 10 mg via INTRAVENOUS

## 2022-02-28 MED ORDER — NITROGLYCERIN 0.4 MG SL SUBL
0.8000 mg | SUBLINGUAL_TABLET | Freq: Once | SUBLINGUAL | Status: AC
  Administered 2022-02-28: 0.8 mg via SUBLINGUAL

## 2022-02-28 MED ORDER — IOHEXOL 350 MG/ML SOLN
75.0000 mL | Freq: Once | INTRAVENOUS | Status: AC | PRN
  Administered 2022-02-28: 75 mL via INTRAVENOUS

## 2022-02-28 NOTE — Progress Notes (Signed)
Patient tolerated procedure well. Ambulate w/o difficulty. Denies light headedness or being dizzy. Sitting in chair drinking water provided. Encouraged to drink extra water today and reasoning explained. Verbalized understanding. All questions answered. ABC intact. No further needs. Discharge from procedure area w/o issues.   °

## 2022-03-06 DIAGNOSIS — M62830 Muscle spasm of back: Secondary | ICD-10-CM | POA: Diagnosis not present

## 2022-03-06 DIAGNOSIS — M9902 Segmental and somatic dysfunction of thoracic region: Secondary | ICD-10-CM | POA: Diagnosis not present

## 2022-03-06 DIAGNOSIS — M5134 Other intervertebral disc degeneration, thoracic region: Secondary | ICD-10-CM | POA: Diagnosis not present

## 2022-03-06 DIAGNOSIS — M9903 Segmental and somatic dysfunction of lumbar region: Secondary | ICD-10-CM | POA: Diagnosis not present

## 2022-03-15 ENCOUNTER — Encounter: Payer: Self-pay | Admitting: Obstetrics and Gynecology

## 2022-03-21 ENCOUNTER — Ambulatory Visit: Payer: BC Managed Care – PPO | Attending: Nurse Practitioner | Admitting: Nurse Practitioner

## 2022-03-21 ENCOUNTER — Encounter: Payer: Self-pay | Admitting: Nurse Practitioner

## 2022-03-21 VITALS — BP 138/88 | HR 61 | Ht 69.5 in | Wt 185.6 lb

## 2022-03-21 DIAGNOSIS — I2542 Coronary artery dissection: Secondary | ICD-10-CM | POA: Diagnosis not present

## 2022-03-21 DIAGNOSIS — R072 Precordial pain: Secondary | ICD-10-CM

## 2022-03-21 DIAGNOSIS — E785 Hyperlipidemia, unspecified: Secondary | ICD-10-CM | POA: Diagnosis not present

## 2022-03-21 DIAGNOSIS — I10 Essential (primary) hypertension: Secondary | ICD-10-CM | POA: Diagnosis not present

## 2022-03-21 NOTE — Patient Instructions (Signed)
Medication Instructions:  No changes at this time.   *If you need a refill on your cardiac medications before your next appointment, please call your pharmacy*   Lab Work: None  If you have labs (blood work) drawn today and your tests are completely normal, you will receive your results only by: [place] (if you have MyChart) OR A paper copy in the mail If you have any lab test that is abnormal or we need to change your treatment, we will call you to review the results.   Testing/Procedures: None   Follow-Up: At [facility], you and your health needs are our priority.  As part of our continuing mission to provide you with exceptional heart care, we have created designated Provider Care Teams.  These Care Teams include your primary Cardiologist (physician) and Advanced Practice Providers (APPs -  Physician Assistants and Nurse Practitioners) who all work together to provide you with the care you need, when you need it.   Your next appointment:   6 month(s)  The format for your next appointment:   In Person  Provider:   Ida Rogue, MD or Murray Hodgkins, NP        Important Information About Sugar

## 2022-03-21 NOTE — Progress Notes (Signed)
Office Visit    Patient Name: Nicole Zuniga Date of Encounter: 03/21/2022  Primary Care Provider:  [name], MD Primary Cardiologist:  Ida Rogue, MD  Chief Complaint    46 year old female with a history of non-STEMI and presumed spontaneous coronary artery dissection in 2019, hypertension, hyperlipidemia, anxiety, and rheumatoid arthritis, who presents for follow-up related to chest pain and recent coronary CT angiogram.  Past Medical History    Past Medical History:  Diagnosis Date   Allergic rhinitis    Anxiety    Basal cell carcinoma 2006   left superior lateral chest at breast   Essential hypertension    History of echocardiogram    a. 04/2017 Echo: EF 60-65%, no rwma, nl RV fxn.   Non-ST elevation (NSTEMI) myocardial infarction ([place])    a. 04/2017 presumed SCAD.   Panic attack    RA (rheumatoid arthritis) (HCC)    Spontaneous dissection of coronary artery    a. 04/2017 NSTEMI/Cath: LM nl, LAD nl, D1-3 nl, LCX 30m OM1-3 nl, RCA nl, RPDA/RPLB1-2 nl. EF 55-65%; b. 02/2022 Cor CTA: Ca2+ = 0. Nl Cors. mild athero of desc thor Ao.   Past Surgical History:  Procedure Laterality Date   COLONOSCOPY WITH PROPOFOL N/A 02/14/2022   Procedure: COLONOSCOPY WITH PROPOFOL;  Surgeon: AJonathon Bellows MD;  [facility];  Service: Gastroenterology;  Laterality: N/A;   [facility]  [facility]    LEFT HEART CATH AND CORONARY ANGIOGRAPHY N/A 05/02/2017   Procedure: LEFT HEART CATH AND CORONARY ANGIOGRAPHY;  Surgeon: AWellington Hampshire MD;  [facility];  Service: Cardiovascular;  Laterality: N/A;   NECK SURGERY     Excision of lymph node    Allergies  Allergies  Allergen Reactions   Lisinopril     Other reaction(s): Cough, Other (See Comments)    History of Present Illness    46year old female with the above past medical history including non-STEMI with presumed spontaneous coronary artery dissection,  hypertension, hyperlipidemia, anxiety, and rheumatoid arthritis.  She was admitted in January 2019 with chest pain and troponin elevation.  Echo showed an EF of 60 to 65%.  Diagnostic catheterization revealed a 50% stenosis in the mid left circumflex with otherwise normal coronary arteries.  It was felt that presentation was most likely secondary to spontaneous coronary artery dissection, and she was medically managed.  Ms. HLawsonwas recently seen in clinic in October 2023 with complaints of fairly focal, sharp, and somewhat fleeting chest discomfort that did not seem to limit her activity.  Given her history, coronary CT angiogram was performed and showed normal coronary arteries with a calcium score of 0.  Mild descending thoracic aortic atherosclerosis was noted.  Since her last visit, she has had only 1 brief episode of sharp and pinching focal right-sided chest discomfort, that occurred on Thanksgiving day and has not occurred since.  She remains reasonably active without symptoms or limitations.  She denies dyspnea, palpitations, PND, orthopnea, dizziness, syncope, edema, or early satiety.  Home Medications    Current Outpatient Medications  Medication Sig Dispense Refill   ALPRAZolam (XANAX) 0.25 MG tablet Take 0.25 mg by mouth at bedtime as needed for anxiety.   5   ASPIRIN LOW DOSE 81 MG tablet TAKE 1 TABLET BY MOUTH EVERY DAY 30 tablet 9   bisoprolol (ZEBETA) 5 MG tablet Take 1 tablet (5 mg total) by mouth daily. 90 tablet 3  busPIRone (BUSPAR) 5 MG tablet Take 5 mg by mouth. Once monthly     Cholecalciferol (VITAMIN D) 2000 units tablet Take 2,000 Units by mouth daily.     ENBREL SURECLICK 50 MG/ML injection Inject 50 mg into the skin once a week. Takes on the weekend  2   fluticasone (FLONASE) 50 MCG/ACT nasal spray Place into the nose.     levocetirizine (XYZAL) 5 MG tablet Take 5 mg by mouth daily.     losartan (COZAAR) 25 MG tablet Take 1 tablet (25 mg total) by mouth daily. 90  tablet 3   Multiple Vitamin (MULTI-VITAMINS) TABS Take 1 tablet by mouth daily.     naproxen sodium (ALEVE) 220 MG tablet Take by mouth.     rosuvastatin (CRESTOR) 5 MG tablet TAKE 1 TABLET(5 MG) BY MOUTH DAILY 30 tablet 11   No current facility-administered medications for this visit.     Review of Systems    Only 1 additional episode of pinching type right-sided focal chest discomfort.  She otherwise denies dyspnea, palpitations, PND, orthopnea, dizziness, syncope, edema, or early satiety.  All other systems reviewed and are otherwise negative except as noted above.    Physical Exam    VS:  BP (!) 156/92 (BP Location: Left Arm, Patient Position: Sitting, Cuff Size: Normal)   Pulse 61   Ht 5' 9.5" (1.765 m)   Wt 185 lb 9.6 oz (84.2 kg)   LMP 10/24/2021 (Approximate)   SpO2 98%   BMI 27.02 kg/m  , BMI Body mass index is 27.02 kg/m.     Vitals:   03/21/22 1525 03/21/22 1730  BP: (!) 156/92 138/88  Pulse: 61   SpO2: 98%     GEN: Well nourished, well developed, in no acute distress. HEENT: normal. Neck: Supple, no JVD, carotid bruits, or masses. Cardiac: RRR, no murmurs, rubs, or gallops. No clubbing, cyanosis, edema.  Radials 2+/PT 2+ and equal bilaterally.  Respiratory:  Respirations regular and unlabored, clear to auscultation bilaterally. GI: Soft, nontender, nondistended, BS + x 4. MS: no deformity or atrophy. Skin: warm and dry, no rash. Neuro:  Strength and sensation are intact. Psych: Normal affect.  Accessory Clinical Findings     Lab Results  Component Value Date   WBC 6.9 05/03/2017   HGB 13.1 05/03/2017   HCT 39.6 05/03/2017   MCV 89.1 05/03/2017   PLT 270 05/03/2017   Lab Results  Component Value Date   CREATININE 0.83 02/06/2022   BUN 25 (H) 02/06/2022   NA 140 02/06/2022   K 4.2 02/06/2022   CL 105 02/06/2022   CO2 29 02/06/2022   Assessment & Plan    1.  Precordial chest pain/nonobstructive CAD/history of spontaneous coronary artery  dissection: Status post non-STEMI in 2019 with finding of moderate left circumflex disease and presumption of spontaneous coronary artery dissection, which was medically managed.  She was recently evaluated for focal, right-sided upper chest discomfort and pinching sensation.  Coronary CT angiogram was undertaken and showed normal coronary arteries with a calcium score of 0.  We discussed results in detail today.  Reassurance offered.  She has only had 1 episode of pinching chest discomfort since her last visit and has otherwise been feeling well.  She remains on aspirin, beta-blocker, ARB, and statin therapy.  2.  Essential hypertension: Likely component of whitecoat hypertension.  She says her blood pressure is always elevated in the offices but typically runs in the 120-130 range at home.  I repeated her  blood pressure today and got 138/88.  She will continue to follow blood pressures at home and remains on beta-blocker and ARB therapy.  3.  Hyperlipidemia: LDL of 75 in April.  She has lost quite a bit of weight over the past year.  Encouraged ongoing regular activity and healthy lifestyle.  Continue current dose of statin therapy.  4.  Disposition: Follow-up in 6 months or sooner if necessary.   Murray Hodgkins, NP 03/21/2022, 4:43 PM

## 2022-03-27 DIAGNOSIS — M5134 Other intervertebral disc degeneration, thoracic region: Secondary | ICD-10-CM | POA: Diagnosis not present

## 2022-03-27 DIAGNOSIS — M9902 Segmental and somatic dysfunction of thoracic region: Secondary | ICD-10-CM | POA: Diagnosis not present

## 2022-03-27 DIAGNOSIS — M62830 Muscle spasm of back: Secondary | ICD-10-CM | POA: Diagnosis not present

## 2022-03-27 DIAGNOSIS — M9903 Segmental and somatic dysfunction of lumbar region: Secondary | ICD-10-CM | POA: Diagnosis not present

## 2022-04-17 DIAGNOSIS — M5134 Other intervertebral disc degeneration, thoracic region: Secondary | ICD-10-CM | POA: Diagnosis not present

## 2022-04-17 DIAGNOSIS — M62830 Muscle spasm of back: Secondary | ICD-10-CM | POA: Diagnosis not present

## 2022-04-17 DIAGNOSIS — M9903 Segmental and somatic dysfunction of lumbar region: Secondary | ICD-10-CM | POA: Diagnosis not present

## 2022-04-17 DIAGNOSIS — M9902 Segmental and somatic dysfunction of thoracic region: Secondary | ICD-10-CM | POA: Diagnosis not present

## 2022-05-15 DIAGNOSIS — M5134 Other intervertebral disc degeneration, thoracic region: Secondary | ICD-10-CM | POA: Diagnosis not present

## 2022-05-15 DIAGNOSIS — M62830 Muscle spasm of back: Secondary | ICD-10-CM | POA: Diagnosis not present

## 2022-05-15 DIAGNOSIS — M9902 Segmental and somatic dysfunction of thoracic region: Secondary | ICD-10-CM | POA: Diagnosis not present

## 2022-05-15 DIAGNOSIS — M9903 Segmental and somatic dysfunction of lumbar region: Secondary | ICD-10-CM | POA: Diagnosis not present

## 2022-06-11 DIAGNOSIS — M5134 Other intervertebral disc degeneration, thoracic region: Secondary | ICD-10-CM | POA: Diagnosis not present

## 2022-06-11 DIAGNOSIS — M9902 Segmental and somatic dysfunction of thoracic region: Secondary | ICD-10-CM | POA: Diagnosis not present

## 2022-06-11 DIAGNOSIS — M62830 Muscle spasm of back: Secondary | ICD-10-CM | POA: Diagnosis not present

## 2022-06-11 DIAGNOSIS — M9903 Segmental and somatic dysfunction of lumbar region: Secondary | ICD-10-CM | POA: Diagnosis not present

## 2022-07-02 DIAGNOSIS — M0880 Other juvenile arthritis, unspecified site: Secondary | ICD-10-CM | POA: Diagnosis not present

## 2022-07-02 DIAGNOSIS — Z79899 Other long term (current) drug therapy: Secondary | ICD-10-CM | POA: Diagnosis not present

## 2022-07-09 DIAGNOSIS — M9903 Segmental and somatic dysfunction of lumbar region: Secondary | ICD-10-CM | POA: Diagnosis not present

## 2022-07-09 DIAGNOSIS — M5134 Other intervertebral disc degeneration, thoracic region: Secondary | ICD-10-CM | POA: Diagnosis not present

## 2022-07-09 DIAGNOSIS — M62830 Muscle spasm of back: Secondary | ICD-10-CM | POA: Diagnosis not present

## 2022-07-09 DIAGNOSIS — M9902 Segmental and somatic dysfunction of thoracic region: Secondary | ICD-10-CM | POA: Diagnosis not present

## 2022-07-10 DIAGNOSIS — D225 Melanocytic nevi of trunk: Secondary | ICD-10-CM | POA: Diagnosis not present

## 2022-07-10 DIAGNOSIS — C44519 Basal cell carcinoma of skin of other part of trunk: Secondary | ICD-10-CM | POA: Diagnosis not present

## 2022-07-10 DIAGNOSIS — D2272 Melanocytic nevi of left lower limb, including hip: Secondary | ICD-10-CM | POA: Diagnosis not present

## 2022-07-10 DIAGNOSIS — D485 Neoplasm of uncertain behavior of skin: Secondary | ICD-10-CM | POA: Diagnosis not present

## 2022-07-10 DIAGNOSIS — D2261 Melanocytic nevi of right upper limb, including shoulder: Secondary | ICD-10-CM | POA: Diagnosis not present

## 2022-07-10 DIAGNOSIS — D1801 Hemangioma of skin and subcutaneous tissue: Secondary | ICD-10-CM | POA: Diagnosis not present

## 2022-07-10 DIAGNOSIS — D2262 Melanocytic nevi of left upper limb, including shoulder: Secondary | ICD-10-CM | POA: Diagnosis not present

## 2022-07-10 DIAGNOSIS — L538 Other specified erythematous conditions: Secondary | ICD-10-CM | POA: Diagnosis not present

## 2022-07-10 DIAGNOSIS — R208 Other disturbances of skin sensation: Secondary | ICD-10-CM | POA: Diagnosis not present

## 2022-07-10 DIAGNOSIS — C44712 Basal cell carcinoma of skin of right lower limb, including hip: Secondary | ICD-10-CM | POA: Diagnosis not present

## 2022-07-15 ENCOUNTER — Other Ambulatory Visit: Payer: Self-pay | Admitting: Obstetrics and Gynecology

## 2022-07-15 DIAGNOSIS — Z1231 Encounter for screening mammogram for malignant neoplasm of breast: Secondary | ICD-10-CM

## 2022-07-16 DIAGNOSIS — S76012A Strain of muscle, fascia and tendon of left hip, initial encounter: Secondary | ICD-10-CM | POA: Diagnosis not present

## 2022-07-16 DIAGNOSIS — M6281 Muscle weakness (generalized): Secondary | ICD-10-CM | POA: Diagnosis not present

## 2022-07-21 ENCOUNTER — Other Ambulatory Visit: Payer: Self-pay | Admitting: Cardiovascular Disease

## 2022-07-24 DIAGNOSIS — S76012A Strain of muscle, fascia and tendon of left hip, initial encounter: Secondary | ICD-10-CM | POA: Diagnosis not present

## 2022-08-01 DIAGNOSIS — M6281 Muscle weakness (generalized): Secondary | ICD-10-CM | POA: Diagnosis not present

## 2022-08-01 DIAGNOSIS — S76012A Strain of muscle, fascia and tendon of left hip, initial encounter: Secondary | ICD-10-CM | POA: Diagnosis not present

## 2022-08-06 DIAGNOSIS — M9902 Segmental and somatic dysfunction of thoracic region: Secondary | ICD-10-CM | POA: Diagnosis not present

## 2022-08-06 DIAGNOSIS — M9903 Segmental and somatic dysfunction of lumbar region: Secondary | ICD-10-CM | POA: Diagnosis not present

## 2022-08-06 DIAGNOSIS — M62830 Muscle spasm of back: Secondary | ICD-10-CM | POA: Diagnosis not present

## 2022-08-06 DIAGNOSIS — M5134 Other intervertebral disc degeneration, thoracic region: Secondary | ICD-10-CM | POA: Diagnosis not present

## 2022-08-07 DIAGNOSIS — S76012A Strain of muscle, fascia and tendon of left hip, initial encounter: Secondary | ICD-10-CM | POA: Diagnosis not present

## 2022-08-13 DIAGNOSIS — Z0000 Encounter for general adult medical examination without abnormal findings: Secondary | ICD-10-CM | POA: Diagnosis not present

## 2022-08-13 DIAGNOSIS — Z131 Encounter for screening for diabetes mellitus: Secondary | ICD-10-CM | POA: Diagnosis not present

## 2022-08-13 DIAGNOSIS — Z1389 Encounter for screening for other disorder: Secondary | ICD-10-CM | POA: Diagnosis not present

## 2022-08-14 DIAGNOSIS — S76012A Strain of muscle, fascia and tendon of left hip, initial encounter: Secondary | ICD-10-CM | POA: Diagnosis not present

## 2022-08-19 DIAGNOSIS — S73192D Other sprain of left hip, subsequent encounter: Secondary | ICD-10-CM | POA: Diagnosis not present

## 2022-08-19 DIAGNOSIS — S76012D Strain of muscle, fascia and tendon of left hip, subsequent encounter: Secondary | ICD-10-CM | POA: Diagnosis not present

## 2022-08-19 DIAGNOSIS — M25852 Other specified joint disorders, left hip: Secondary | ICD-10-CM | POA: Diagnosis not present

## 2022-08-20 ENCOUNTER — Ambulatory Visit
Admission: RE | Admit: 2022-08-20 | Discharge: 2022-08-20 | Disposition: A | Discharge status: Home / Self Care | Payer: BC Managed Care – PPO | Source: Ambulatory Visit | Attending: Obstetrics and Gynecology | Admitting: Obstetrics and Gynecology

## 2022-08-20 DIAGNOSIS — Z1231 Encounter for screening mammogram for malignant neoplasm of breast: Secondary | ICD-10-CM | POA: Diagnosis not present

## 2022-08-21 DIAGNOSIS — M0880 Other juvenile arthritis, unspecified site: Secondary | ICD-10-CM | POA: Diagnosis not present

## 2022-08-21 DIAGNOSIS — Z0000 Encounter for general adult medical examination without abnormal findings: Secondary | ICD-10-CM | POA: Diagnosis not present

## 2022-09-03 DIAGNOSIS — M9902 Segmental and somatic dysfunction of thoracic region: Secondary | ICD-10-CM | POA: Diagnosis not present

## 2022-09-03 DIAGNOSIS — M9903 Segmental and somatic dysfunction of lumbar region: Secondary | ICD-10-CM | POA: Diagnosis not present

## 2022-09-03 DIAGNOSIS — M62830 Muscle spasm of back: Secondary | ICD-10-CM | POA: Diagnosis not present

## 2022-09-03 DIAGNOSIS — M5134 Other intervertebral disc degeneration, thoracic region: Secondary | ICD-10-CM | POA: Diagnosis not present

## 2022-09-04 DIAGNOSIS — S76012A Strain of muscle, fascia and tendon of left hip, initial encounter: Secondary | ICD-10-CM | POA: Diagnosis not present

## 2022-09-04 DIAGNOSIS — M6281 Muscle weakness (generalized): Secondary | ICD-10-CM | POA: Diagnosis not present

## 2022-09-06 DIAGNOSIS — C44519 Basal cell carcinoma of skin of other part of trunk: Secondary | ICD-10-CM | POA: Diagnosis not present

## 2022-09-06 DIAGNOSIS — D235 Other benign neoplasm of skin of trunk: Secondary | ICD-10-CM | POA: Diagnosis not present

## 2022-09-16 DIAGNOSIS — S76012A Strain of muscle, fascia and tendon of left hip, initial encounter: Secondary | ICD-10-CM | POA: Diagnosis not present

## 2022-09-18 DIAGNOSIS — C44519 Basal cell carcinoma of skin of other part of trunk: Secondary | ICD-10-CM | POA: Diagnosis not present

## 2022-09-23 NOTE — Progress Notes (Unsigned)
Cardiology Office Note  Date:  09/24/2022   ID:  Nicole Zuniga, DOB [birth year], MRN [id number]  PCP:  Danella Penton, MD   Chief Complaint  Patient presents with   6 month follow up     "Doing well." Medications reviewed by the patient verbally.     HPI:  47 y.o. female w/ a h/o  RA, on enbrel anxiety, panic attacks,  admitted 04/30/2017 with chest pain and NSTEMI -  trop peaked @ 4.38.  suggestive but not conclusive of spontaneous coronary artery dissection. Echo 1/17 showed nl EF w/o wma's. Presents for follow-up after recent hospitalization for non-STEMI, coronary artery disease  Last seen by myself in clinic 4/23 Seen by one of our providers December 2023  Recently Lost father  Completed coronary CT angiogram  Showing normal coronary arteries with a calcium score of 0.   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 The left ventricular systolic function is normal. LV end diastolic pressure is mildly elevated. The left ventricular ejection fraction is 55-65% by visual estimate.   1.  Moderate one-vessel coronary artery disease with 50% stenosis in the mid left circumflex that has an appearance that is suggestive but not conclusive of spontaneous coronary artery dissection. 2.  Normal LV systolic function and mildly elevated left ventricular end-diastolic pressure.   PMH:   has a past  medical history of Allergic rhinitis, Anxiety, Basal cell carcinoma (2006), Essential hypertension, History of echocardiogram, Non-ST elevation (NSTEMI) myocardial infarction (HCC), Panic attack, RA (rheumatoid arthritis) (HCC), and Spontaneous dissection of coronary artery.  PSH:    Past Surgical History:  Procedure Laterality Date   COLONOSCOPY WITH PROPOFOL N/A 02/14/2022   Procedure: COLONOSCOPY WITH PROPOFOL;  Surgeon: Wyline Mood, MD;  Location: [facility] ENDOSCOPY;  Service: Gastroenterology;  Laterality: N/A;   HERNIA REPAIR  07/2010   [facility]   KNEE SURGERY Left    LEFT HEART CATH AND CORONARY ANGIOGRAPHY N/A 05/02/2017   Procedure: LEFT HEART CATH AND CORONARY ANGIOGRAPHY;  Surgeon: Iran Ouch, MD;  Location: [facility] INVASIVE CV LAB;  Service: Cardiovascular;  Laterality: N/A;   NECK SURGERY     Excision of lymph node    Current Outpatient Medications  Medication Sig Dispense Refill   ALPRAZolam (XANAX) 0.25 MG tablet Take 0.25 mg by mouth at bedtime as needed for anxiety.   5   ASPIRIN LOW DOSE 81 MG tablet TAKE 1 TABLET BY MOUTH EVERY DAY 30 tablet 9   bisoprolol (ZEBETA) 5 MG tablet Take 1 tablet (5 mg total) by mouth daily. 90 tablet 3   Cholecalciferol (VITAMIN D) 2000 units tablet Take 2,000 Units by mouth daily.     ENBREL SURECLICK 50 MG/ML injection Inject 50 mg into the skin once a week. Takes on the weekend  2   fluticasone (FLONASE) 50 MCG/ACT nasal spray Place into the nose.     levocetirizine (XYZAL) 5 MG tablet Take 5 mg by mouth daily.  Multiple Vitamin (MULTI-VITAMINS) TABS Take 1 tablet by mouth daily.     naproxen sodium (ALEVE) 220 MG tablet Take by mouth.     rosuvastatin (CRESTOR) 5 MG tablet TAKE 1 TABLET(5 MG) BY MOUTH DAILY 30 tablet 1   busPIRone (BUSPAR) 5 MG tablet Take 5 mg by mouth. Once monthly (Patient not taking: Reported on 09/24/2022)     No current facility-administered medications for this visit.    Allergies:   Lisinopril   Social History:   The patient  reports that she has never smoked. She has never used smokeless tobacco. She reports current alcohol use. She reports that she does not use drugs.   Family History:   family history includes CAD in her father; Congestive Heart Failure in her father; Diabetes in her mother and sister; Hyperlipidemia in her father and mother; Hypertension in her father and mother; Leukemia in her paternal grandfather.    Review of Systems: Review of Systems  Constitutional: Negative.   HENT: Negative.    Respiratory: Negative.    Cardiovascular: Negative.   Gastrointestinal: Negative.   Musculoskeletal: Negative.   Neurological: Negative.   Psychiatric/Behavioral: Negative.    All other systems reviewed and are negative.   PHYSICAL EXAM: VS:  BP (!) 140/92 (BP Location: Left Arm, Patient Position: Sitting, Cuff Size: Normal)   Pulse 65   Ht 5' 9.5" (1.765 m)   Wt 189 lb 2 oz (85.8 kg)   SpO2 98%   BMI 27.53 kg/m  , BMI Body mass index is 27.53 kg/m. Constitutional:  oriented to person, place, and time. No distress.  HENT:  Head: Grossly normal Eyes:  no discharge. No scleral icterus.  Neck: No JVD, no carotid bruits  Cardiovascular: Regular rate and rhythm, no murmurs appreciated Pulmonary/Chest: Clear to auscultation bilaterally, no wheezes or rails Abdominal: Soft.  no distension.  no tenderness.  Musculoskeletal: Normal range of motion Neurological:  normal muscle tone. Coordination normal. No atrophy Skin: Skin warm and dry Psychiatric: normal affect, pleasant  Recent Labs: 02/06/2022: BUN 25; Creatinine, Ser 0.83; Potassium 4.2; Sodium 140    Lipid Panel No results found for: "CHOL", "HDL", "LDLCALC", "TRIG"    Wt Readings from Last 3 Encounters:  09/24/22 189 lb 2 oz (85.8 kg)  03/21/22 185 lb 9.6 oz (84.2 kg)  02/14/22 177 lb (80.3 kg)      ASSESSMENT AND PLAN:  NSTEMI (non-ST elevated myocardial infarction) (HCC)  Felt to be secondary to coronary  dissection mid left circumflex nonocclusive Recent cardiac CTA with calcium score 0, no significant coronary artery disease, no stenosis Continue Crestor, bisoprolol If blood pressure remains elevated, restart losartan 25 daily Recommend regular exercise program  Essential hypertension -  Was rushing from work, blood pressure elevated As blood pressure was recently well-controlled, losartan held by Dr. Hyacinth Meeker Recommend she closely monitor blood pressure at home and if it continues to run high would restart losartan 25 and continue bisoprolol 5 daily  Rheumatoid arthritis, involving unspecified site, unspecified rheumatoid factor presence (HCC) Managed by primary care and rheumatology Reports symptoms stable    Total encounter time more than 30 minutes  Greater than 50% was spent in counseling and coordination of care with the patient   Orders Placed This Encounter  Procedures   EKG 12-Lead     Signed, Dossie Arbour, M.D., Ph.D. 09/24/2022  [facility] [place], [place] [phone]

## 2022-09-24 ENCOUNTER — Encounter: Payer: Self-pay | Admitting: Cardiovascular Disease

## 2022-09-24 ENCOUNTER — Ambulatory Visit: Payer: BC Managed Care – PPO | Attending: Cardiovascular Disease | Admitting: Cardiovascular Disease

## 2022-09-24 VITALS — BP 140/92 | HR 65 | Ht 69.5 in | Wt 189.1 lb

## 2022-09-24 DIAGNOSIS — I2542 Coronary artery dissection: Secondary | ICD-10-CM | POA: Diagnosis not present

## 2022-09-24 DIAGNOSIS — R072 Precordial pain: Secondary | ICD-10-CM

## 2022-09-24 DIAGNOSIS — I10 Essential (primary) hypertension: Secondary | ICD-10-CM | POA: Diagnosis not present

## 2022-09-24 DIAGNOSIS — M069 Rheumatoid arthritis, unspecified: Secondary | ICD-10-CM

## 2022-09-24 DIAGNOSIS — E785 Hyperlipidemia, unspecified: Secondary | ICD-10-CM

## 2022-09-24 NOTE — Patient Instructions (Addendum)
Medication Instructions:   If blood pressure elevated, restart losartan 25 daily  If you need a refill on your cardiac medications before your next appointment, please call your pharmacy.   Lab work: No new labs needed  Testing/Procedures: No new testing needed  Follow-Up: At [facility], you and your health needs are our priority.  As part of our continuing mission to provide you with exceptional heart care, we have created designated Provider Care Teams.  These Care Teams include your primary Cardiologist (physician) and Advanced Practice Providers (APPs -  Physician Assistants and Nurse Practitioners) who all work together to provide you with the care you need, when you need it.  You will need a follow up appointment in 12 months  Providers on your designated Care Team:   Nicolasa Ducking, NP Eula Listen, PA-C Cadence Fransico Michael, [place]  COVID-19 Vaccine Information can be found at: [email or web address] For questions related to vaccine distribution or appointments, please email [email or web address] or call [phone].

## 2022-10-02 DIAGNOSIS — S76012A Strain of muscle, fascia and tendon of left hip, initial encounter: Secondary | ICD-10-CM | POA: Diagnosis not present

## 2022-10-02 DIAGNOSIS — M6281 Muscle weakness (generalized): Secondary | ICD-10-CM | POA: Diagnosis not present

## 2022-10-08 DIAGNOSIS — M62830 Muscle spasm of back: Secondary | ICD-10-CM | POA: Diagnosis not present

## 2022-10-08 DIAGNOSIS — M5134 Other intervertebral disc degeneration, thoracic region: Secondary | ICD-10-CM | POA: Diagnosis not present

## 2022-10-08 DIAGNOSIS — M9902 Segmental and somatic dysfunction of thoracic region: Secondary | ICD-10-CM | POA: Diagnosis not present

## 2022-10-08 DIAGNOSIS — M9903 Segmental and somatic dysfunction of lumbar region: Secondary | ICD-10-CM | POA: Diagnosis not present

## 2022-10-09 DIAGNOSIS — S76012A Strain of muscle, fascia and tendon of left hip, initial encounter: Secondary | ICD-10-CM | POA: Diagnosis not present

## 2022-10-15 DIAGNOSIS — S76012A Strain of muscle, fascia and tendon of left hip, initial encounter: Secondary | ICD-10-CM | POA: Diagnosis not present

## 2022-10-22 DIAGNOSIS — S76012A Strain of muscle, fascia and tendon of left hip, initial encounter: Secondary | ICD-10-CM | POA: Diagnosis not present

## 2022-10-25 DIAGNOSIS — M62830 Muscle spasm of back: Secondary | ICD-10-CM | POA: Diagnosis not present

## 2022-10-25 DIAGNOSIS — M9903 Segmental and somatic dysfunction of lumbar region: Secondary | ICD-10-CM | POA: Diagnosis not present

## 2022-10-25 DIAGNOSIS — M5134 Other intervertebral disc degeneration, thoracic region: Secondary | ICD-10-CM | POA: Diagnosis not present

## 2022-10-25 DIAGNOSIS — M9902 Segmental and somatic dysfunction of thoracic region: Secondary | ICD-10-CM | POA: Diagnosis not present

## 2022-11-04 DIAGNOSIS — M6281 Muscle weakness (generalized): Secondary | ICD-10-CM | POA: Diagnosis not present

## 2022-11-04 DIAGNOSIS — S76012A Strain of muscle, fascia and tendon of left hip, initial encounter: Secondary | ICD-10-CM | POA: Diagnosis not present

## 2022-11-05 DIAGNOSIS — M9903 Segmental and somatic dysfunction of lumbar region: Secondary | ICD-10-CM | POA: Diagnosis not present

## 2022-11-05 DIAGNOSIS — M62830 Muscle spasm of back: Secondary | ICD-10-CM | POA: Diagnosis not present

## 2022-11-05 DIAGNOSIS — M9902 Segmental and somatic dysfunction of thoracic region: Secondary | ICD-10-CM | POA: Diagnosis not present

## 2022-11-05 DIAGNOSIS — M5134 Other intervertebral disc degeneration, thoracic region: Secondary | ICD-10-CM | POA: Diagnosis not present

## 2022-11-06 ENCOUNTER — Other Ambulatory Visit: Payer: Self-pay | Admitting: Cardiovascular Disease

## 2022-12-31 DIAGNOSIS — M62830 Muscle spasm of back: Secondary | ICD-10-CM | POA: Diagnosis not present

## 2022-12-31 DIAGNOSIS — M5134 Other intervertebral disc degeneration, thoracic region: Secondary | ICD-10-CM | POA: Diagnosis not present

## 2022-12-31 DIAGNOSIS — M9903 Segmental and somatic dysfunction of lumbar region: Secondary | ICD-10-CM | POA: Diagnosis not present

## 2022-12-31 DIAGNOSIS — M9902 Segmental and somatic dysfunction of thoracic region: Secondary | ICD-10-CM | POA: Diagnosis not present

## 2023-01-28 DIAGNOSIS — M62830 Muscle spasm of back: Secondary | ICD-10-CM | POA: Diagnosis not present

## 2023-01-28 DIAGNOSIS — M5134 Other intervertebral disc degeneration, thoracic region: Secondary | ICD-10-CM | POA: Diagnosis not present

## 2023-01-28 DIAGNOSIS — M9902 Segmental and somatic dysfunction of thoracic region: Secondary | ICD-10-CM | POA: Diagnosis not present

## 2023-01-28 DIAGNOSIS — M9903 Segmental and somatic dysfunction of lumbar region: Secondary | ICD-10-CM | POA: Diagnosis not present

## 2023-02-06 ENCOUNTER — Telehealth: Payer: Self-pay | Admitting: Obstetrics and Gynecology

## 2023-02-06 NOTE — Telephone Encounter (Signed)
LM for patient to call office back to schedule annual appointment with [facility] patient is due after 02/28/23

## 2023-02-12 DIAGNOSIS — L821 Other seborrheic keratosis: Secondary | ICD-10-CM | POA: Diagnosis not present

## 2023-02-12 DIAGNOSIS — D2261 Melanocytic nevi of right upper limb, including shoulder: Secondary | ICD-10-CM | POA: Diagnosis not present

## 2023-02-12 DIAGNOSIS — D225 Melanocytic nevi of trunk: Secondary | ICD-10-CM | POA: Diagnosis not present

## 2023-02-12 DIAGNOSIS — D0462 Carcinoma in situ of skin of left upper limb, including shoulder: Secondary | ICD-10-CM | POA: Diagnosis not present

## 2023-02-12 DIAGNOSIS — D2262 Melanocytic nevi of left upper limb, including shoulder: Secondary | ICD-10-CM | POA: Diagnosis not present

## 2023-02-12 DIAGNOSIS — D485 Neoplasm of uncertain behavior of skin: Secondary | ICD-10-CM | POA: Diagnosis not present

## 2023-02-12 DIAGNOSIS — C44619 Basal cell carcinoma of skin of left upper limb, including shoulder: Secondary | ICD-10-CM | POA: Diagnosis not present

## 2023-02-25 ENCOUNTER — Encounter: Payer: Self-pay | Admitting: Cardiovascular Disease

## 2023-02-25 DIAGNOSIS — M9902 Segmental and somatic dysfunction of thoracic region: Secondary | ICD-10-CM | POA: Diagnosis not present

## 2023-02-25 DIAGNOSIS — M9903 Segmental and somatic dysfunction of lumbar region: Secondary | ICD-10-CM | POA: Diagnosis not present

## 2023-02-25 DIAGNOSIS — M5134 Other intervertebral disc degeneration, thoracic region: Secondary | ICD-10-CM | POA: Diagnosis not present

## 2023-02-25 DIAGNOSIS — M62830 Muscle spasm of back: Secondary | ICD-10-CM | POA: Diagnosis not present

## 2023-02-27 ENCOUNTER — Other Ambulatory Visit: Payer: Self-pay

## 2023-02-27 MED ORDER — LOSARTAN POTASSIUM 25 MG PO TABS: 25.0000 mg | ORAL_TABLET | Freq: Every day | ORAL | 3 refills | Status: AC

## 2023-03-05 NOTE — Progress Notes (Unsigned)
PCP: Danella Penton, MD   No chief complaint on file.   HPI:      [name] is a 47 y.o. [phone] whose LMP was No LMP recorded. (Menstrual status: Irregular Periods)., presents today for her annual examination.  Her menses are irregular due to perimenopause; Q1-6 months, lasting 3-6 days, light flow, no BTB, no dysmen. Was having terrible vasomotor sx that improved with wt loss, now they're mild; sx ebb and flow. Tried estroven last yr with sx relief for 6 months.   Had AUB 11/21 with neg GYN u/s and neg EMB 11/21. Hx of irreg menses off and on since 2020.  Hx of HTN, mild non STEMI 1/19.  Sex activity: single partner, contraception - vasectomy. She does not have vaginal dryness.  Last Pap: 12/13/21  Results were: no abnormalities /neg HPV DNA    Last mammogram: 08/20/22 Results were: normal, repeat in 12 months There is no FH of breast cancer. There is no FH of ovarian cancer. The patient does do self-breast exams.  Colonoscopy: 02/14/22 at [facility], repeat due after  yrs. Pt with FH polyps in parents. Hx of internal hemorrhoid; no bleeding.    Tobacco use: The patient denies current or previous tobacco use. Alcohol use: none No drug use Exercise: mod active  She does get adequate calcium and Vitamin D in her diet.  Labs with PCP.   Past Medical History:  Diagnosis Date   Allergic rhinitis    Anxiety    Basal cell carcinoma 2006   left superior lateral chest at breast   Essential hypertension    History of echocardiogram    a. 04/2017 Echo: EF 60-65%, no rwma, nl RV fxn.   Non-ST elevation (NSTEMI) myocardial infarction (HCC)    a. 04/2017 presumed SCAD.   Panic attack    RA (rheumatoid arthritis) (HCC)    Spontaneous dissection of coronary artery    a. 04/2017 NSTEMI/Cath: LM nl, LAD nl, D1-3 nl, LCX 24m, OM1-3 nl, RCA nl, RPDA/RPLB1-2 nl. EF 55-65%; b. 02/2022 Cor CTA: Ca2+ = 0. Nl Cors. mild athero of desc thor Ao.    Past Surgical History:   Procedure Laterality Date   COLONOSCOPY WITH PROPOFOL N/A 02/14/2022   Procedure: COLONOSCOPY WITH PROPOFOL;  Surgeon: Wyline Mood, MD;  Location: [facility] ENDOSCOPY;  Service: Gastroenterology;  Laterality: N/A;   HERNIA REPAIR  07/2010   [facility]   KNEE SURGERY Left    LEFT HEART CATH AND CORONARY ANGIOGRAPHY N/A 05/02/2017   Procedure: LEFT HEART CATH AND CORONARY ANGIOGRAPHY;  Surgeon: Iran Ouch, MD;  Location: [facility] INVASIVE CV LAB;  Service: Cardiovascular;  Laterality: N/A;   NECK SURGERY     Excision of lymph node    Family History  Problem Relation Age of Onset   Hypertension Mother    Hyperlipidemia Mother    Diabetes Mother    CAD Father        s/p CABG in his late 80's   Hypertension Father    Hyperlipidemia Father    Congestive Heart Failure Father    Diabetes Sister    Leukemia Paternal Grandfather     Social History   Socioeconomic History   Marital status: Married    Spouse name: Sherilyn Cooter   Number of children: 3   Years of education: Not on file   Highest education level: Not on file  Occupational History   Occupation: Education officer, environmental  Tobacco Use   Smoking status: Never  Smokeless tobacco: Never  Vaping Use   Vaping status: Never Used  Substance and Sexual Activity   Alcohol use: Yes    Comment: rare   Drug use: No   Sexual activity: Yes    Partners: Male    Birth control/protection: Other-see comments    Comment: Vasectomy  Other Topics Concern   Not on file  Social History Narrative   Lives locally with her husband and three children (ages 67, 91, 30).  Works as Education officer, environmental.  Exercises regularly.   Social Determinants of Health   Financial Resource Strain: Not on file  Food Insecurity: Not on file  Transportation Needs: Not on file  Physical Activity: Insufficiently Active (05/06/2017)   Exercise Vital Sign    Days of Exercise per Week: 4 days    Minutes of Exercise per Session: 30 min  Stress: Stress Concern Present (05/06/2017)    [employer] of Occupational Health - Occupational Stress Questionnaire    Feeling of Stress : To some extent  Social Connections: Socially Integrated (05/06/2017)   Social Connection and Isolation Panel [NHANES]    Frequency of Communication with Friends and Family: More than three times a week    Frequency of Social Gatherings with Friends and Family: Once a week    Attends Religious Services: More than 4 times per year    Active Member of [employer] or Organizations: Yes    Attends Engineer, structural: More than 4 times per year    Marital Status: Married  Catering manager Violence: Not At Risk (05/06/2017)   Humiliation, Afraid, Rape, and Kick questionnaire    Fear of Current or Ex-Partner: No    Emotionally Abused: No    Physically Abused: No    Sexually Abused: No     Current Outpatient Medications:    losartan (COZAAR) 25 MG tablet, Take 1 tablet (25 mg total) by mouth daily., Disp: 90 tablet, Rfl: 3   ALPRAZolam (XANAX) 0.25 MG tablet, Take 0.25 mg by mouth at bedtime as needed for anxiety. , Disp: , Rfl: 5   ASPIRIN LOW DOSE 81 MG tablet, TAKE 1 TABLET BY MOUTH EVERY DAY, Disp: 30 tablet, Rfl: 9   bisoprolol (ZEBETA) 5 MG tablet, Take 1 tablet (5 mg total) by mouth daily., Disp: 90 tablet, Rfl: 3   busPIRone (BUSPAR) 5 MG tablet, Take 5 mg by mouth. Once monthly (Patient not taking: Reported on 09/24/2022), Disp: , Rfl:    Cholecalciferol (VITAMIN D) 2000 units tablet, Take 2,000 Units by mouth daily., Disp: , Rfl:    ENBREL SURECLICK 50 MG/ML injection, Inject 50 mg into the skin once a week. Takes on the weekend, Disp: , Rfl: 2   fluticasone (FLONASE) 50 MCG/ACT nasal spray, Place into the nose., Disp: , Rfl:    levocetirizine (XYZAL) 5 MG tablet, Take 5 mg by mouth daily., Disp: , Rfl:    Multiple Vitamin (MULTI-VITAMINS) TABS, Take 1 tablet by mouth daily., Disp: , Rfl:    naproxen sodium (ALEVE) 220 MG tablet, Take by mouth., Disp: , Rfl:    rosuvastatin (CRESTOR)  5 MG tablet, TAKE 1 TABLET(5 MG) BY MOUTH DAILY, Disp: 30 tablet, Rfl: 1     ROS:  Review of Systems  Constitutional:  Negative for fatigue, fever and unexpected weight change.  Respiratory:  Negative for cough, shortness of breath and wheezing.   Cardiovascular:  Negative for chest pain, palpitations and leg swelling.  Gastrointestinal:  Negative for blood in stool, constipation, diarrhea, nausea  and vomiting.  Endocrine: Negative for cold intolerance, heat intolerance and polyuria.  Genitourinary:  Negative for dyspareunia, dysuria, flank pain, frequency, genital sores, hematuria, menstrual problem, pelvic pain, urgency, vaginal bleeding, vaginal discharge and vaginal pain.  Musculoskeletal:  Negative for back pain, joint swelling and myalgias.  Skin:  Negative for rash.  Neurological:  Negative for dizziness, syncope, light-headedness, numbness and headaches.  Hematological:  Negative for adenopathy.  Psychiatric/Behavioral:  Negative for agitation, confusion, sleep disturbance and suicidal ideas. The patient is not nervous/anxious.   BREAST: No symptoms    Objective: There were no vitals taken for this visit.   Physical Exam Constitutional:      Appearance: She is well-developed.  Genitourinary:     Vulva normal.     Right Labia: No rash, tenderness or lesions.    Left Labia: No tenderness, lesions or rash.    No vaginal discharge, erythema or tenderness.      Right Adnexa: not tender and no mass present.    Left Adnexa: not tender and no mass present.    No cervical friability or polyp.     Uterus is not enlarged or tender.  Breasts:    Right: No mass, nipple discharge, skin change or tenderness.     Left: No mass, nipple discharge, skin change or tenderness.  Neck:     Thyroid: No thyromegaly.  Cardiovascular:     Rate and Rhythm: Normal rate and regular rhythm.     Heart sounds: Normal heart sounds. No murmur heard. Pulmonary:     Effort: Pulmonary effort is  normal.     Breath sounds: Normal breath sounds.  Abdominal:     Palpations: Abdomen is soft.     Tenderness: There is no abdominal tenderness. There is no guarding or rebound.  Musculoskeletal:        General: Normal range of motion.     Cervical back: Normal range of motion.  Lymphadenopathy:     Cervical: No cervical adenopathy.  Neurological:     General: No focal deficit present.     Mental Status: She is alert and oriented to person, place, and time.     Cranial Nerves: No cranial nerve deficit.  Skin:    General: Skin is warm and dry.  Psychiatric:        Mood and Affect: Mood normal.        Behavior: Behavior normal.        Thought Content: Thought content normal.        Judgment: Judgment normal.  Vitals reviewed.     Assessment/Plan:  Encounter for annual routine gynecological examination  Cervical cancer screening - Plan: Cytology - PAP  Screening for HPV (human papillomavirus) - Plan: Cytology - PAP  Encounter for screening mammogram for malignant neoplasm of breast; pt current on mammo  Screening for colon cancer - Plan: Ambulatory referral to Gastroenterology; refer to GI for scr colonoscopy due to age; can also discuss hemorrhoids.   Perimenopause--f/u prn AUB.          GYN counsel breast self exam, mammography screening, menopause, adequate intake of calcium and vitamin D, diet and exercise    F/U  No follow-ups on file.  Nicole Tippins B. Regan Llorente, PA-C 03/05/2023 4:42 PM

## 2023-03-06 ENCOUNTER — Encounter: Payer: Self-pay | Admitting: Obstetrics and Gynecology

## 2023-03-06 ENCOUNTER — Ambulatory Visit (INDEPENDENT_AMBULATORY_CARE_PROVIDER_SITE_OTHER): Payer: BC Managed Care – PPO | Admitting: Obstetrics and Gynecology

## 2023-03-06 VITALS — BP 169/99 | HR 59 | Ht 69.0 in | Wt 196.0 lb

## 2023-03-06 DIAGNOSIS — Z1211 Encounter for screening for malignant neoplasm of colon: Secondary | ICD-10-CM

## 2023-03-06 DIAGNOSIS — N951 Menopausal and female climacteric states: Secondary | ICD-10-CM

## 2023-03-06 DIAGNOSIS — M79604 Pain in right leg: Secondary | ICD-10-CM

## 2023-03-06 DIAGNOSIS — Z1231 Encounter for screening mammogram for malignant neoplasm of breast: Secondary | ICD-10-CM

## 2023-03-06 DIAGNOSIS — Z01419 Encounter for gynecological examination (general) (routine) without abnormal findings: Secondary | ICD-10-CM | POA: Diagnosis not present

## 2023-03-06 NOTE — Patient Instructions (Signed)
I value your feedback and you entrusting us with your care. If you get a [facility] patient survey, I would appreciate you taking the time to let us know about your experience today. Thank you! ? ? ?

## 2023-03-12 DIAGNOSIS — C44519 Basal cell carcinoma of skin of other part of trunk: Secondary | ICD-10-CM | POA: Diagnosis not present

## 2023-03-12 DIAGNOSIS — D045 Carcinoma in situ of skin of trunk: Secondary | ICD-10-CM | POA: Diagnosis not present

## 2023-03-25 DIAGNOSIS — M9902 Segmental and somatic dysfunction of thoracic region: Secondary | ICD-10-CM | POA: Diagnosis not present

## 2023-03-25 DIAGNOSIS — M5134 Other intervertebral disc degeneration, thoracic region: Secondary | ICD-10-CM | POA: Diagnosis not present

## 2023-03-25 DIAGNOSIS — M9903 Segmental and somatic dysfunction of lumbar region: Secondary | ICD-10-CM | POA: Diagnosis not present

## 2023-03-25 DIAGNOSIS — M62830 Muscle spasm of back: Secondary | ICD-10-CM | POA: Diagnosis not present

## 2023-04-22 DIAGNOSIS — M9903 Segmental and somatic dysfunction of lumbar region: Secondary | ICD-10-CM | POA: Diagnosis not present

## 2023-04-22 DIAGNOSIS — M62830 Muscle spasm of back: Secondary | ICD-10-CM | POA: Diagnosis not present

## 2023-04-22 DIAGNOSIS — M9902 Segmental and somatic dysfunction of thoracic region: Secondary | ICD-10-CM | POA: Diagnosis not present

## 2023-04-22 DIAGNOSIS — M5134 Other intervertebral disc degeneration, thoracic region: Secondary | ICD-10-CM | POA: Diagnosis not present

## 2023-05-20 DIAGNOSIS — M62830 Muscle spasm of back: Secondary | ICD-10-CM | POA: Diagnosis not present

## 2023-05-20 DIAGNOSIS — M9902 Segmental and somatic dysfunction of thoracic region: Secondary | ICD-10-CM | POA: Diagnosis not present

## 2023-05-20 DIAGNOSIS — M9903 Segmental and somatic dysfunction of lumbar region: Secondary | ICD-10-CM | POA: Diagnosis not present

## 2023-05-20 DIAGNOSIS — M5134 Other intervertebral disc degeneration, thoracic region: Secondary | ICD-10-CM | POA: Diagnosis not present

## 2023-06-05 DIAGNOSIS — J101 Influenza due to other identified influenza virus with other respiratory manifestations: Secondary | ICD-10-CM | POA: Diagnosis not present

## 2023-06-05 DIAGNOSIS — B349 Viral infection, unspecified: Secondary | ICD-10-CM | POA: Diagnosis not present

## 2023-06-05 DIAGNOSIS — R051 Acute cough: Secondary | ICD-10-CM | POA: Diagnosis not present

## 2023-06-17 DIAGNOSIS — M5134 Other intervertebral disc degeneration, thoracic region: Secondary | ICD-10-CM | POA: Diagnosis not present

## 2023-06-17 DIAGNOSIS — M62830 Muscle spasm of back: Secondary | ICD-10-CM | POA: Diagnosis not present

## 2023-06-17 DIAGNOSIS — M9902 Segmental and somatic dysfunction of thoracic region: Secondary | ICD-10-CM | POA: Diagnosis not present

## 2023-06-17 DIAGNOSIS — M9903 Segmental and somatic dysfunction of lumbar region: Secondary | ICD-10-CM | POA: Diagnosis not present

## 2023-07-08 DIAGNOSIS — Z79899 Other long term (current) drug therapy: Secondary | ICD-10-CM | POA: Diagnosis not present

## 2023-07-08 DIAGNOSIS — M0880 Other juvenile arthritis, unspecified site: Secondary | ICD-10-CM | POA: Diagnosis not present

## 2023-07-15 DIAGNOSIS — M9903 Segmental and somatic dysfunction of lumbar region: Secondary | ICD-10-CM | POA: Diagnosis not present

## 2023-07-15 DIAGNOSIS — M5134 Other intervertebral disc degeneration, thoracic region: Secondary | ICD-10-CM | POA: Diagnosis not present

## 2023-07-15 DIAGNOSIS — M62830 Muscle spasm of back: Secondary | ICD-10-CM | POA: Diagnosis not present

## 2023-07-15 DIAGNOSIS — M9902 Segmental and somatic dysfunction of thoracic region: Secondary | ICD-10-CM | POA: Diagnosis not present

## 2023-08-12 DIAGNOSIS — M62830 Muscle spasm of back: Secondary | ICD-10-CM | POA: Diagnosis not present

## 2023-08-12 DIAGNOSIS — M9902 Segmental and somatic dysfunction of thoracic region: Secondary | ICD-10-CM | POA: Diagnosis not present

## 2023-08-12 DIAGNOSIS — M9903 Segmental and somatic dysfunction of lumbar region: Secondary | ICD-10-CM | POA: Diagnosis not present

## 2023-08-12 DIAGNOSIS — M5134 Other intervertebral disc degeneration, thoracic region: Secondary | ICD-10-CM | POA: Diagnosis not present

## 2023-08-19 DIAGNOSIS — Z1389 Encounter for screening for other disorder: Secondary | ICD-10-CM | POA: Diagnosis not present

## 2023-08-19 DIAGNOSIS — Z0000 Encounter for general adult medical examination without abnormal findings: Secondary | ICD-10-CM | POA: Diagnosis not present

## 2023-08-19 DIAGNOSIS — Z131 Encounter for screening for diabetes mellitus: Secondary | ICD-10-CM | POA: Diagnosis not present

## 2023-08-19 DIAGNOSIS — Z13220 Encounter for screening for lipoid disorders: Secondary | ICD-10-CM | POA: Diagnosis not present

## 2023-08-21 ENCOUNTER — Ambulatory Visit
Admission: RE | Admit: 2023-08-21 | Discharge: 2023-08-21 | Disposition: A | Discharge status: Home / Self Care | Source: Ambulatory Visit | Attending: Obstetrics and Gynecology | Admitting: Obstetrics and Gynecology

## 2023-08-21 DIAGNOSIS — Z1231 Encounter for screening mammogram for malignant neoplasm of breast: Secondary | ICD-10-CM

## 2023-08-26 ENCOUNTER — Ambulatory Visit: Payer: Self-pay | Admitting: Obstetrics and Gynecology

## 2023-08-26 DIAGNOSIS — R1010 Upper abdominal pain, unspecified: Secondary | ICD-10-CM | POA: Diagnosis not present

## 2023-08-26 DIAGNOSIS — Z8249 Family history of ischemic heart disease and other diseases of the circulatory system: Secondary | ICD-10-CM | POA: Diagnosis not present

## 2023-08-26 DIAGNOSIS — Z0000 Encounter for general adult medical examination without abnormal findings: Secondary | ICD-10-CM | POA: Diagnosis not present

## 2023-08-26 DIAGNOSIS — G4710 Hypersomnia, unspecified: Secondary | ICD-10-CM | POA: Diagnosis not present

## 2023-08-28 ENCOUNTER — Encounter: Payer: Self-pay | Admitting: Internal Medicine

## 2023-08-29 ENCOUNTER — Other Ambulatory Visit: Payer: Self-pay | Admitting: Internal Medicine

## 2023-08-29 DIAGNOSIS — R1010 Upper abdominal pain, unspecified: Secondary | ICD-10-CM

## 2023-08-29 DIAGNOSIS — Z8249 Family history of ischemic heart disease and other diseases of the circulatory system: Secondary | ICD-10-CM

## 2023-09-09 DIAGNOSIS — M5134 Other intervertebral disc degeneration, thoracic region: Secondary | ICD-10-CM | POA: Diagnosis not present

## 2023-09-09 DIAGNOSIS — M62830 Muscle spasm of back: Secondary | ICD-10-CM | POA: Diagnosis not present

## 2023-09-09 DIAGNOSIS — M9902 Segmental and somatic dysfunction of thoracic region: Secondary | ICD-10-CM | POA: Diagnosis not present

## 2023-09-09 DIAGNOSIS — M9903 Segmental and somatic dysfunction of lumbar region: Secondary | ICD-10-CM | POA: Diagnosis not present

## 2023-09-10 DIAGNOSIS — D1801 Hemangioma of skin and subcutaneous tissue: Secondary | ICD-10-CM | POA: Diagnosis not present

## 2023-09-10 DIAGNOSIS — D485 Neoplasm of uncertain behavior of skin: Secondary | ICD-10-CM | POA: Diagnosis not present

## 2023-09-10 DIAGNOSIS — L820 Inflamed seborrheic keratosis: Secondary | ICD-10-CM | POA: Diagnosis not present

## 2023-09-10 DIAGNOSIS — L814 Other melanin hyperpigmentation: Secondary | ICD-10-CM | POA: Diagnosis not present

## 2023-10-07 DIAGNOSIS — M62830 Muscle spasm of back: Secondary | ICD-10-CM | POA: Diagnosis not present

## 2023-10-07 DIAGNOSIS — M9902 Segmental and somatic dysfunction of thoracic region: Secondary | ICD-10-CM | POA: Diagnosis not present

## 2023-10-07 DIAGNOSIS — M9903 Segmental and somatic dysfunction of lumbar region: Secondary | ICD-10-CM | POA: Diagnosis not present

## 2023-10-07 DIAGNOSIS — M5134 Other intervertebral disc degeneration, thoracic region: Secondary | ICD-10-CM | POA: Diagnosis not present

## 2023-11-04 DIAGNOSIS — M9903 Segmental and somatic dysfunction of lumbar region: Secondary | ICD-10-CM | POA: Diagnosis not present

## 2023-11-04 DIAGNOSIS — M9902 Segmental and somatic dysfunction of thoracic region: Secondary | ICD-10-CM | POA: Diagnosis not present

## 2023-11-04 DIAGNOSIS — M62830 Muscle spasm of back: Secondary | ICD-10-CM | POA: Diagnosis not present

## 2023-11-04 DIAGNOSIS — M5134 Other intervertebral disc degeneration, thoracic region: Secondary | ICD-10-CM | POA: Diagnosis not present

## 2023-11-22 DIAGNOSIS — G4733 Obstructive sleep apnea (adult) (pediatric): Secondary | ICD-10-CM | POA: Diagnosis not present

## 2023-11-25 DIAGNOSIS — Z8249 Family history of ischemic heart disease and other diseases of the circulatory system: Secondary | ICD-10-CM | POA: Diagnosis not present

## 2023-11-25 DIAGNOSIS — R109 Unspecified abdominal pain: Secondary | ICD-10-CM | POA: Diagnosis not present

## 2023-11-25 DIAGNOSIS — R1010 Upper abdominal pain, unspecified: Secondary | ICD-10-CM | POA: Diagnosis not present

## 2023-12-04 ENCOUNTER — Encounter: Payer: Self-pay | Admitting: Obstetrics and Gynecology

## 2023-12-04 DIAGNOSIS — K649 Unspecified hemorrhoids: Secondary | ICD-10-CM

## 2023-12-05 ENCOUNTER — Other Ambulatory Visit: Payer: Self-pay

## 2023-12-05 MED ORDER — HYDROCORTISONE ACETATE 25 MG RE SUPP: 25.0000 mg | Freq: Two times a day (BID) | RECTAL | 0 refills | Status: DC

## 2023-12-05 NOTE — Telephone Encounter (Signed)
 Patient called requesting medication for hemorrhoid pain. She has been taking suppositories and it does not relieve pain. Advised by Slater that it was ok to send medication to pharmacy to get her through the weekend. Called patient. Patient aware. Verbalized understanding.

## 2023-12-08 DIAGNOSIS — M9903 Segmental and somatic dysfunction of lumbar region: Secondary | ICD-10-CM | POA: Diagnosis not present

## 2023-12-08 DIAGNOSIS — M9902 Segmental and somatic dysfunction of thoracic region: Secondary | ICD-10-CM | POA: Diagnosis not present

## 2023-12-08 DIAGNOSIS — M5134 Other intervertebral disc degeneration, thoracic region: Secondary | ICD-10-CM | POA: Diagnosis not present

## 2023-12-08 DIAGNOSIS — M62830 Muscle spasm of back: Secondary | ICD-10-CM | POA: Diagnosis not present

## 2024-01-13 DIAGNOSIS — M5134 Other intervertebral disc degeneration, thoracic region: Secondary | ICD-10-CM | POA: Diagnosis not present

## 2024-01-13 DIAGNOSIS — M9903 Segmental and somatic dysfunction of lumbar region: Secondary | ICD-10-CM | POA: Diagnosis not present

## 2024-01-13 DIAGNOSIS — M62830 Muscle spasm of back: Secondary | ICD-10-CM | POA: Diagnosis not present

## 2024-01-13 DIAGNOSIS — M9902 Segmental and somatic dysfunction of thoracic region: Secondary | ICD-10-CM | POA: Diagnosis not present

## 2024-02-10 DIAGNOSIS — M62830 Muscle spasm of back: Secondary | ICD-10-CM | POA: Diagnosis not present

## 2024-02-10 DIAGNOSIS — M9903 Segmental and somatic dysfunction of lumbar region: Secondary | ICD-10-CM | POA: Diagnosis not present

## 2024-02-10 DIAGNOSIS — M9902 Segmental and somatic dysfunction of thoracic region: Secondary | ICD-10-CM | POA: Diagnosis not present

## 2024-02-10 DIAGNOSIS — M5134 Other intervertebral disc degeneration, thoracic region: Secondary | ICD-10-CM | POA: Diagnosis not present

## 2024-02-23 DIAGNOSIS — H00024 Hordeolum internum left upper eyelid: Secondary | ICD-10-CM | POA: Diagnosis not present

## 2024-03-09 DIAGNOSIS — M9902 Segmental and somatic dysfunction of thoracic region: Secondary | ICD-10-CM | POA: Diagnosis not present

## 2024-03-09 DIAGNOSIS — M5134 Other intervertebral disc degeneration, thoracic region: Secondary | ICD-10-CM | POA: Diagnosis not present

## 2024-03-09 DIAGNOSIS — M9903 Segmental and somatic dysfunction of lumbar region: Secondary | ICD-10-CM | POA: Diagnosis not present

## 2024-03-09 DIAGNOSIS — M62830 Muscle spasm of back: Secondary | ICD-10-CM | POA: Diagnosis not present

## 2024-04-06 DIAGNOSIS — M5134 Other intervertebral disc degeneration, thoracic region: Secondary | ICD-10-CM | POA: Diagnosis not present

## 2024-04-06 DIAGNOSIS — M9902 Segmental and somatic dysfunction of thoracic region: Secondary | ICD-10-CM | POA: Diagnosis not present

## 2024-04-06 DIAGNOSIS — M9903 Segmental and somatic dysfunction of lumbar region: Secondary | ICD-10-CM | POA: Diagnosis not present

## 2024-04-06 DIAGNOSIS — M62830 Muscle spasm of back: Secondary | ICD-10-CM | POA: Diagnosis not present

## 2024-04-18 NOTE — Progress Notes (Unsigned)
 Cardiology Office Note  Date:  04/20/2024   ID:  Nicole Zuniga, Nicole Zuniga 1975/12/01, MRN [id number]  PCP:  Cleotilde Oneil FALCON, MD   Chief Complaint  Patient presents with   12 month follow up     Doing well.     HPI:  49 y.o. female w/ a h/o  RA, on enbrel  anxiety, panic attacks,  admitted 04/30/2017 with chest pain and NSTEMI -  trop peaked @ 4.38.  suggestive but not conclusive of spontaneous coronary artery dissection. Echo 1/17 showed nl EF w/o wma's. Presents for follow-up after recent hospitalization for non-STEMI, coronary artery disease  Last seen by myself in clinic 6/24 Seen by one of our providers December 2023  In follow-up today reports doing well Continues stress, works at the [employer] Some stress, husband with slow real estate investment business, added extra financial stressors  Denies significant shortness of breath or chest pain on exertion  Prior cardiac imaging reviewed  coronary CT angiogram November 2023 Showing normal coronary arteries with a calcium  score of 0.  Normal cardiac anatomy  Rheumatoid arthritis stable  Remains on bisoprolol  5 daily with losartan  25 daily Reports blood pressure relatively well-controlled Elevated in the office today, slow improvement on recheck  Lab work  LDL 73 total cholesterol 140 HBA1C 5.5  EKG personally reviewed by myself on todays visit EKG Interpretation Date/Time:  Tuesday April 20 2024 16:40:38 EST Ventricular Rate:  60 PR Interval:  162 QRS Duration:  90 QT Interval:  454 QTC Calculation: 454 R Axis:   32  Text Interpretation: Normal sinus rhythm Nonspecific ST abnormality When compared with ECG of 30-Apr-2017 22:05, No significant change was found Confirmed by Perla Lye [phone]) on 04/20/2024 5:01:09 PM    Other past medical history reviewed  Cardiac catheterization performed 05/02/2017 Mid Cx lesion is 50% stenosed. The left ventricular systolic function is normal. LV end diastolic pressure  is mildly elevated. The left ventricular ejection fraction is 55-65% by visual estimate.   1.  Moderate one-vessel coronary artery disease with 50% stenosis in the mid left circumflex that has an appearance that is suggestive but not conclusive of spontaneous coronary artery dissection. 2.  Normal LV systolic function and mildly elevated left ventricular end-diastolic pressure.   PMH:   has a past medical history of Allergic rhinitis, Anxiety, Basal cell carcinoma (2006), Essential hypertension, History of echocardiogram, Non-ST elevation (NSTEMI) myocardial infarction (HCC), Panic attack, RA (rheumatoid arthritis) (HCC), and Spontaneous dissection of coronary artery.  PSH:    Past Surgical History:  Procedure Laterality Date   COLONOSCOPY WITH PROPOFOL  N/A 02/14/2022   Procedure: COLONOSCOPY WITH PROPOFOL ;  Surgeon: Therisa Bi, MD;  Location: [facility] ENDOSCOPY;  Service: Gastroenterology;  Laterality: N/A;   HERNIA REPAIR  07/2010   [facility]   KNEE SURGERY Left    LEFT HEART CATH AND CORONARY ANGIOGRAPHY N/A 05/02/2017   Procedure: LEFT HEART CATH AND CORONARY ANGIOGRAPHY;  Surgeon: Darron Deatrice LABOR, MD;  Location: [facility] INVASIVE CV LAB;  Service: Cardiovascular;  Laterality: N/A;   NECK SURGERY     Excision of lymph node    Current Outpatient Medications  Medication Sig Dispense Refill   ALPRAZolam  (XANAX ) 0.25 MG tablet Take 0.25 mg by mouth at bedtime as needed for anxiety.   5   bisoprolol  (ZEBETA ) 5 MG tablet Take 1 tablet (5 mg total) by mouth daily. 90 tablet 3   busPIRone (BUSPAR) 5 MG tablet Take 5 mg by mouth. Once monthly     Cholecalciferol   50 MCG (2000 UT) TABS Take 1 tablet by mouth daily.     ENBREL  SURECLICK 50 MG/ML injection Inject 50 mg into the skin once a week. Takes on the weekend  2   fluticasone (FLONASE) 50 MCG/ACT nasal spray Place into the nose.     levocetirizine (XYZAL ) 5 MG tablet Take 5 mg by mouth daily.     losartan  (COZAAR ) 25 MG tablet Take 1 tablet (25 mg  total) by mouth daily. 90 tablet 3   Multiple Vitamin (MULTI-VITAMINS) TABS Take 1 tablet by mouth daily.     naproxen sodium (ALEVE) 220 MG tablet Take by mouth.     rosuvastatin  (CRESTOR ) 5 MG tablet TAKE 1 TABLET(5 MG) BY MOUTH DAILY 30 tablet 1   No current facility-administered medications for this visit.    Allergies:   Lisinopril   Social History:  The patient  reports that she has never smoked. She has never used smokeless tobacco. She reports current alcohol use. She reports that she does not use drugs.   Family History:   family history includes CAD in her father; Congestive Heart Failure in her father; Diabetes in her mother and sister; Hyperlipidemia in her father and mother; Hypertension in her father and mother; Leukemia in her paternal grandfather.    Review of Systems: Review of Systems  Constitutional: Negative.   HENT: Negative.    Respiratory: Negative.    Cardiovascular: Negative.   Gastrointestinal: Negative.   Musculoskeletal: Negative.   Neurological: Negative.   Psychiatric/Behavioral: Negative.    All other systems reviewed and are negative.   PHYSICAL EXAM: VS:  BP (!) 160/100 (BP Location: Left Arm, Patient Position: Sitting, Cuff Size: Normal)   Pulse 60   Ht 5' 9 (1.753 m)   Wt 192 lb 6 oz (87.3 kg)   SpO2 99%   BMI 28.41 kg/m  , BMI Body mass index is 28.41 kg/m. Constitutional:  oriented to person, place, and time. No distress.  HENT:  Head: Grossly normal Eyes:  no discharge. No scleral icterus.  Neck: No JVD, no carotid bruits  Cardiovascular: Regular rate and rhythm, no murmurs appreciated Pulmonary/Chest: Clear to auscultation bilaterally, no wheezes or rales Abdominal: Soft.  no distension.  no tenderness.  Musculoskeletal: Normal range of motion Neurological:  normal muscle tone. Coordination normal. No atrophy Skin: Skin warm and dry Psychiatric: normal affect, pleasant   Recent Labs: No results found for requested labs within  last 365 days.    Lipid Panel No results found for: CHOL, HDL, LDLCALC, TRIG    Wt Readings from Last 3 Encounters:  04/20/24 192 lb 6 oz (87.3 kg)  03/06/23 196 lb (88.9 kg)  09/24/22 189 lb 2 oz (85.8 kg)      ASSESSMENT AND PLAN:  NSTEMI (non-ST elevated myocardial infarction) (HCC)  Felt to be secondary to coronary dissection mid left circumflex nonocclusive Cardiac CTA with calcium  score 0, no coronary stenosis Continue Crestor , bisoprolol  - Stressed importance of aggressive blood pressure control  Essential hypertension -  Currently taking losartan  25 and  bisoprolol  5 daily Blood pressure elevated today, recommend close monitoring at home may need to increase losartan  if blood pressure runs high  Rheumatoid arthritis, involving unspecified site, unspecified rheumatoid factor presence (HCC) Managed by primary care and rheumatology Stable symptoms    Orders Placed This Encounter  Procedures   EKG 12-Lead     Signed, Velinda Lunger, M.D., Ph.D. 04/20/2024  [facility] [place], [place] [phone]

## 2024-04-20 ENCOUNTER — Ambulatory Visit: Attending: Cardiovascular Disease | Admitting: Cardiovascular Disease

## 2024-04-20 ENCOUNTER — Encounter: Payer: Self-pay | Admitting: Cardiovascular Disease

## 2024-04-20 VITALS — BP 140/90 | HR 60 | Ht 69.0 in | Wt 192.4 lb

## 2024-04-20 DIAGNOSIS — I214 Non-ST elevation (NSTEMI) myocardial infarction: Secondary | ICD-10-CM

## 2024-04-20 DIAGNOSIS — I10 Essential (primary) hypertension: Secondary | ICD-10-CM

## 2024-04-20 DIAGNOSIS — M069 Rheumatoid arthritis, unspecified: Secondary | ICD-10-CM | POA: Diagnosis not present

## 2024-04-20 NOTE — Patient Instructions (Signed)
